# Patient Record
Sex: Female | Born: 1990 | Race: White | Hispanic: No | Marital: Married | Smoking: Never smoker
Health system: Southern US, Community
[De-identification: ages and names within clinical notes are randomized; demographics above are authoritative.]

## PROBLEM LIST (undated history)

## (undated) ENCOUNTER — Inpatient Hospital Stay: Payer: Self-pay

## (undated) DIAGNOSIS — N200 Calculus of kidney: Secondary | ICD-10-CM

## (undated) DIAGNOSIS — Z87442 Personal history of urinary calculi: Secondary | ICD-10-CM

## (undated) DIAGNOSIS — Z9889 Other specified postprocedural states: Secondary | ICD-10-CM

## (undated) DIAGNOSIS — N920 Excessive and frequent menstruation with regular cycle: Secondary | ICD-10-CM

## (undated) DIAGNOSIS — R112 Nausea with vomiting, unspecified: Secondary | ICD-10-CM

## (undated) DIAGNOSIS — E282 Polycystic ovarian syndrome: Secondary | ICD-10-CM

## (undated) DIAGNOSIS — N390 Urinary tract infection, site not specified: Secondary | ICD-10-CM

## (undated) DIAGNOSIS — E119 Type 2 diabetes mellitus without complications: Secondary | ICD-10-CM

## (undated) HISTORY — DX: Excessive and frequent menstruation with regular cycle: N92.0

## (undated) HISTORY — DX: Type 2 diabetes mellitus without complications: E11.9

## (undated) HISTORY — DX: Urinary tract infection, site not specified: N39.0

## (undated) HISTORY — DX: Calculus of kidney: N20.0

## (undated) HISTORY — PX: NO PAST SURGERIES: SHX2092

## (undated) HISTORY — PX: WISDOM TOOTH EXTRACTION: SHX21

## (undated) HISTORY — DX: Polycystic ovarian syndrome: E28.2

---

## 2010-10-23 DIAGNOSIS — N200 Calculus of kidney: Secondary | ICD-10-CM

## 2010-10-23 HISTORY — DX: Calculus of kidney: N20.0

## 2010-11-04 ENCOUNTER — Ambulatory Visit: Admit: 2010-11-04 | Payer: Self-pay | Admitting: Family Medicine

## 2011-06-27 ENCOUNTER — Emergency Department: Payer: Self-pay | Admitting: Emergency Medicine

## 2013-12-15 ENCOUNTER — Encounter: Payer: Self-pay | Admitting: Family Medicine

## 2013-12-15 ENCOUNTER — Ambulatory Visit (INDEPENDENT_AMBULATORY_CARE_PROVIDER_SITE_OTHER): Payer: 59 | Admitting: Family Medicine

## 2013-12-15 VITALS — BP 130/84 | HR 94 | Temp 98.0°F | Ht 63.5 in | Wt 220.0 lb

## 2013-12-15 DIAGNOSIS — Z113 Encounter for screening for infections with a predominantly sexual mode of transmission: Secondary | ICD-10-CM

## 2013-12-15 DIAGNOSIS — E119 Type 2 diabetes mellitus without complications: Secondary | ICD-10-CM | POA: Insufficient documentation

## 2013-12-15 DIAGNOSIS — R7303 Prediabetes: Secondary | ICD-10-CM

## 2013-12-15 DIAGNOSIS — R7309 Other abnormal glucose: Secondary | ICD-10-CM

## 2013-12-15 DIAGNOSIS — Z136 Encounter for screening for cardiovascular disorders: Secondary | ICD-10-CM | POA: Insufficient documentation

## 2013-12-15 DIAGNOSIS — Z0000 Encounter for general adult medical examination without abnormal findings: Secondary | ICD-10-CM | POA: Insufficient documentation

## 2013-12-15 NOTE — Progress Notes (Signed)
   Subjective:   Patient ID: [name], female    DOB: 04/01/1991, 23 y.o.   MRN: [id number]  [name] is a pleasant 23 y.o. year old female who presents to clinic today with Establish Care  on 12/15/2013  HPI: G0.  Just had first pap smear done at [facility]- was normal.  Was told she is pre diabetic.  For awhile, was checking her FSBS three times a day- stopped doing it once it was controlled- most fasting readings in low 100s.  Has tried to make changes in her diet.  Does have family history of HTN.  She is sexually active with one partner.    Patient Active Problem List   Diagnosis Date Noted  . Pre-diabetes 12/15/2013  . Routine general medical examination at a health care facility 12/15/2013  . Screening for ischemic heart disease 12/15/2013   Past Medical History  Diagnosis Date  . Kidney stone 2012  . UTI (lower urinary tract infection)    History reviewed. No pertinent past surgical history. History  Substance Use Topics  . Smoking status: Never Smoker   . Smokeless tobacco: Never Used  . Alcohol Use: Yes   Family History  Problem Relation Age of Onset  . Arthritis Paternal Grandmother   . Hypertension Paternal Grandmother   . Diabetes Paternal Grandmother    No Known Allergies No current outpatient prescriptions on file prior to visit.   No current facility-administered medications on file prior to visit.   The PMH, PSH, Social History, Family History, Medications, and allergies have [facility], and have been updated if relevant.   Review of Systems    see HPI Patient reports no  vision/ hearing changes,anorexia, weight change, fever ,adenopathy, persistant / recurrent hoarseness, swallowing issues, chest pain, edema,persistant / recurrent cough, hemoptysis, dyspnea(rest, exertional, paroxysmal nocturnal), gastrointestinal  bleeding (melena, rectal bleeding), abdominal pain, excessive heart burn, GU symptoms(dysuria,  hematuria, pyuria, voiding/incontinence  Issues) syncope, focal weakness, severe memory loss, concerning skin lesions, depression, anxiety, abnormal bruising/bleeding, major joint swelling, breast masses or abnormal vaginal bleeding.    Objective:    BP 130/84  Pulse 94  Temp(Src) 98 F (36.7 C) (Oral)  Ht 5' 3.5" (1.613 m)  Wt 220 lb (99.791 kg)  BMI 38.36 kg/m2  SpO2 98%  LMP 12/03/2013   Physical Exam  Nursing note and vitals reviewed. Constitutional: She is oriented to person, place, and time. She appears well-developed and well-nourished. No distress.  HENT:  Head: Normocephalic and atraumatic.  Eyes: Pupils are equal, round, and reactive to light.  Neck: Normal range of motion. Neck supple.  Cardiovascular: Normal rate and regular rhythm.   Pulmonary/Chest: Effort normal and breath sounds normal.  Abdominal: Soft. Bowel sounds are normal.  Musculoskeletal: Normal range of motion.  Neurological: She is alert and oriented to person, place, and time.  Skin: Skin is warm and dry.  Psychiatric: She has a normal mood and affect. Her behavior is normal. Judgment and thought content normal.          Assessment & Plan:   Pre-diabetes - Plan: Hemoglobin A1c  Routine general medical examination at a health care facility - Plan: CBC with Differential, Comprehensive metabolic panel  Screening for ischemic heart disease - Plan: Lipid Panel  Screening for STD (sexually transmitted disease) - Plan: HIV Antibody, RPR, HSV(herpes simplex vrs) 1+2 ab-IgG No Follow-up on file.

## 2013-12-15 NOTE — Assessment & Plan Note (Signed)
Check a1c today

## 2013-12-15 NOTE — Progress Notes (Signed)
Pre visit review using our clinic review tool, if applicable. No additional management support is needed unless otherwise documented below in the visit note. 

## 2013-12-15 NOTE — Patient Instructions (Signed)
We will call you with your lab results. You can also view your lab results online.  It was very nice to meet you.

## 2013-12-15 NOTE — Assessment & Plan Note (Signed)
Reviewed preventive care protocols, scheduled due services, and updated immunizations Discussed nutrition, exercise, diet, and healthy lifestyle.  Orders Placed This Encounter  Procedures  . Hemoglobin A1c  . CBC with Differential  . Comprehensive metabolic panel  . Lipid Panel  . HIV Antibody  . RPR  . HSV(herpes simplex vrs) 1+2 ab-IgG

## 2013-12-16 LAB — CBC WITH DIFFERENTIAL/PLATELET
Basophils Absolute: 0 10*3/uL (ref 0.0–0.2)
Basos: 0 %
EOS: 1 %
Eosinophils Absolute: 0.1 10*3/uL (ref 0.0–0.4)
HCT: 39.4 % (ref 34.0–46.6)
Hemoglobin: 13.2 g/dL (ref 11.1–15.9)
IMMATURE GRANULOCYTES: 0 %
Immature Grans (Abs): 0 10*3/uL (ref 0.0–0.1)
LYMPHS ABS: 2.9 10*3/uL (ref 0.7–3.1)
Lymphs: 41 %
MCH: 27.6 pg (ref 26.6–33.0)
MCHC: 33.5 g/dL (ref 31.5–35.7)
MCV: 82 fL (ref 79–97)
Monocytes Absolute: 0.3 10*3/uL (ref 0.1–0.9)
Monocytes: 5 %
Neutrophils Absolute: 3.7 10*3/uL (ref 1.4–7.0)
Neutrophils Relative %: 53 %
RBC: 4.78 x10E6/uL (ref 3.77–5.28)
RDW: 12.8 % (ref 12.3–15.4)
WBC: 7 10*3/uL (ref 3.4–10.8)

## 2013-12-16 LAB — HEMOGLOBIN A1C
Est. average glucose Bld gHb Est-mCnc: 140 mg/dL
Hgb A1c MFr Bld: 6.5 % — ABNORMAL HIGH (ref 4.8–5.6)

## 2013-12-16 LAB — COMPREHENSIVE METABOLIC PANEL
A/G RATIO: 1.4 (ref 1.1–2.5)
ALK PHOS: 60 IU/L (ref 39–117)
ALT: 19 IU/L (ref 0–32)
AST: 16 IU/L (ref 0–40)
Albumin: 4 g/dL (ref 3.5–5.5)
BUN / CREAT RATIO: 12 (ref 8–20)
BUN: 8 mg/dL (ref 6–20)
CO2: 20 mmol/L (ref 18–29)
CREATININE: 0.69 mg/dL (ref 0.57–1.00)
Calcium: 9.8 mg/dL (ref 8.7–10.2)
Chloride: 103 mmol/L (ref 97–108)
GFR calc Af Amer: 142 mL/min/{1.73_m2} (ref >59)
GFR, EST NON AFRICAN AMERICAN: 123 mL/min/{1.73_m2} (ref >59)
GLOBULIN, TOTAL: 2.8 g/dL (ref 1.5–4.5)
Glucose: 195 mg/dL — ABNORMAL HIGH (ref 65–99)
Potassium: 4.2 mmol/L (ref 3.5–5.2)
SODIUM: 142 mmol/L (ref 134–144)
Total Bilirubin: <0.2 mg/dL (ref 0.0–1.2)
Total Protein: 6.8 g/dL (ref 6.0–8.5)

## 2013-12-16 LAB — LIPID PANEL
CHOL/HDL RATIO: 2.9 ratio (ref 0.0–4.4)
Cholesterol, Total: 139 mg/dL (ref 100–189)
HDL: 48 mg/dL (ref >39)
LDL Calculated: 68 mg/dL (ref 0–119)
Triglycerides: 114 mg/dL (ref 0–114)
VLDL Cholesterol Cal: 23 mg/dL (ref 5–40)

## 2013-12-16 LAB — RPR: SYPHILIS RPR SCR: NONREACTIVE

## 2013-12-16 LAB — HIV ANTIBODY (ROUTINE TESTING W REFLEX): HIV-1/HIV-2 Ab: NONREACTIVE

## 2013-12-16 LAB — HSV(HERPES SIMPLEX VRS) I + II AB-IGG
HSV 1 IGG, TYPE SPEC: 60.9 {index} — AB (ref 0.00–0.90)
HSV 2 IGG,TYPE SPECIFIC AB: <0.91 index (ref 0.00–0.90)

## 2013-12-26 ENCOUNTER — Ambulatory Visit: Payer: 59 | Admitting: Family Medicine

## 2014-01-02 ENCOUNTER — Encounter: Payer: Self-pay | Admitting: Family Medicine

## 2014-01-02 ENCOUNTER — Ambulatory Visit (INDEPENDENT_AMBULATORY_CARE_PROVIDER_SITE_OTHER): Payer: 59 | Admitting: Family Medicine

## 2014-01-02 VITALS — BP 132/74 | HR 84 | Temp 97.9°F | Wt 223.5 lb

## 2014-01-02 DIAGNOSIS — N926 Irregular menstruation, unspecified: Secondary | ICD-10-CM

## 2014-01-02 DIAGNOSIS — N912 Amenorrhea, unspecified: Secondary | ICD-10-CM | POA: Insufficient documentation

## 2014-01-02 DIAGNOSIS — E119 Type 2 diabetes mellitus without complications: Secondary | ICD-10-CM

## 2014-01-02 MED ORDER — METFORMIN HCL 500 MG PO TABS: 500.0000 mg | ORAL_TABLET | Freq: Two times a day (BID) | ORAL | Status: DC

## 2014-01-02 NOTE — Patient Instructions (Signed)
Good to see you,  [name]. Please call us to let us know which meter you have at home.  We will call you with your diabetic nutritionist referral.  Take Metformin as directed- 1 tablet twice daily.

## 2014-01-02 NOTE — Progress Notes (Signed)
Pre visit review using our clinic review tool, if applicable. No additional management support is needed unless otherwise documented below in the visit note. 

## 2014-01-02 NOTE — Progress Notes (Signed)
   Subjective:   Patient ID: [name], female    DOB: 09/13/1991, 23 y.o.   MRN: [id number]  [name] is a pleasant 23 y.o. year old female who presents to clinic today with Diabetes  on 01/02/2014  HPI: H/o prediabetes for years, recent lab work done here showed she is now a diabetic.  Lab Results  Component Value Date   HGBA1C 6.5* 12/15/2013   Strong FH of diabetes- dad is insulin dependent.  Has not noticed any increased thirst or urination.  Patient Active Problem List   Diagnosis Date Noted  . Amenorrhea 01/02/2014  . Diabetes mellitus, new onset 12/15/2013  . Routine general medical examination at a health care facility 12/15/2013  . Screening for ischemic heart disease 12/15/2013   Past Medical History  Diagnosis Date  . Kidney stone 2012  . UTI (lower urinary tract infection)    No past surgical history on file. History  Substance Use Topics  . Smoking status: Never Smoker   . Smokeless tobacco: Never Used  . Alcohol Use: Yes   Family History  Problem Relation Age of Onset  . Arthritis Paternal Grandmother   . Hypertension Paternal Grandmother   . Diabetes Paternal Grandmother    No Known Allergies Current Outpatient Prescriptions on File Prior to Visit  Medication Sig Dispense Refill  . drospirenone-ethinyl estradiol (YAZ,GIANVI,LORYNA) 3-0.02 MG tablet Take 1 tablet by mouth daily.       No current facility-administered medications on file prior to visit.   The PMH, PSH, Social History, Family History, Medications, and allergies have been [facility], and have been updated if relevant.   Review of Systems See HPI No increased thirst or urination No blurred vision    Objective:    BP 132/74  Pulse 84  Temp(Src) 97.9 F (36.6 C) (Oral)  Wt 223 lb 8 oz (101.379 kg)  SpO2 98%  LMP 12/19/2013   Physical Exam Gen:  Alert, pleasant, NAD Psych:  Good eye contact, not anxious or depressed appearing       Assessment &  Plan:   Irregular menstrual cycle - Plan: POCT urine pregnancy  Amenorrhea  Diabetes mellitus, new onset - Plan: Amb Referral to Nutrition and Diabetic E Return in about 3 months (around 04/04/2014) for medication follow up..Marland Kitchen

## 2014-01-02 NOTE — Assessment & Plan Note (Signed)
>  25 minutes spent in face to face time with patient, >50% spent in counselling or coordination of care. Start Metformin 500 mg twice daily. Refer to diabetic nutritionist. Given Eat Right Diet handout. She has glucometer at home- will call us with name so we can send in rx for test strips. Follow up in 3 months.

## 2014-01-09 ENCOUNTER — Telehealth: Payer: Self-pay | Admitting: *Deleted

## 2014-01-09 NOTE — Telephone Encounter (Signed)
Spoke to pt who was seen last week and prescribed metformin. She reports that she has PCOS and has been experiencing sharp pains in her stomach. She is questioning if this could be a s/s of starting metformin, and if so is there anything that she can take or be given to help subside pain

## 2014-01-13 NOTE — Telephone Encounter (Signed)
Metformin can cause some abdominal cramping and diarrhea.  There is not much we can do except stop the Metformin if the pain is too severe.  Please call to check on her tomorrow.

## 2014-01-14 NOTE — Telephone Encounter (Signed)
Lm on pts vm requesting a call back 

## 2014-01-15 NOTE — Telephone Encounter (Signed)
Lm on pts vm requesting a call back 

## 2014-01-16 NOTE — Telephone Encounter (Signed)
vm received from pt indicating ok to leave detailed message. Advised pt per Dr Dayton MartesAron and requested that she contact office back with an update of s/s

## 2015-02-18 ENCOUNTER — Other Ambulatory Visit: Payer: Self-pay | Admitting: Family Medicine

## 2016-05-25 LAB — OB RESULTS CONSOLE ABO/RH: "RH Type ": POSITIVE

## 2016-05-25 LAB — OB RESULTS CONSOLE GC/CHLAMYDIA
Chlamydia: NEGATIVE
Gonorrhea: NEGATIVE

## 2016-05-25 LAB — OB RESULTS CONSOLE RUBELLA ANTIBODY, IGM
RUBELLA: IMMUNE
Rubella: IMMUNE

## 2016-05-25 LAB — OB RESULTS CONSOLE ANTIBODY SCREEN: Antibody Screen: NEGATIVE

## 2016-05-25 LAB — OB RESULTS CONSOLE VARICELLA ZOSTER ANTIBODY, IGG: Varicella: IMMUNE

## 2016-05-30 LAB — OB RESULTS CONSOLE VARICELLA ZOSTER ANTIBODY, IGG: Varicella: IMMUNE

## 2016-09-27 ENCOUNTER — Encounter: Payer: Self-pay | Admitting: *Deleted

## 2016-09-27 ENCOUNTER — Observation Stay
Admission: EM | Admit: 2016-09-27 | Discharge: 2016-09-27 | Disposition: A | Discharge status: Home / Self Care | Payer: BLUE CROSS/BLUE SHIELD | Attending: Obstetrics & Gynecology | Admitting: Obstetrics & Gynecology

## 2016-09-27 DIAGNOSIS — O368120 Decreased fetal movements, second trimester, not applicable or unspecified: Secondary | ICD-10-CM | POA: Diagnosis not present

## 2016-09-27 DIAGNOSIS — Z3A25 25 weeks gestation of pregnancy: Secondary | ICD-10-CM | POA: Insufficient documentation

## 2016-09-27 DIAGNOSIS — R10A1 Flank pain, right side: Secondary | ICD-10-CM | POA: Diagnosis present

## 2016-09-27 DIAGNOSIS — R109 Unspecified abdominal pain: Secondary | ICD-10-CM | POA: Insufficient documentation

## 2016-09-27 DIAGNOSIS — O368190 Decreased fetal movements, unspecified trimester, not applicable or unspecified: Secondary | ICD-10-CM | POA: Diagnosis present

## 2016-09-27 DIAGNOSIS — M549 Dorsalgia, unspecified: Secondary | ICD-10-CM | POA: Diagnosis not present

## 2016-09-27 DIAGNOSIS — O26892 Other specified pregnancy related conditions, second trimester: Secondary | ICD-10-CM | POA: Insufficient documentation

## 2016-09-27 LAB — URINALYSIS, ROUTINE W REFLEX MICROSCOPIC
BILIRUBIN URINE: NEGATIVE
GLUCOSE, UA: NEGATIVE mg/dL
HGB URINE DIPSTICK: NEGATIVE
KETONES UR: NEGATIVE mg/dL
Leukocytes, UA: NEGATIVE
Nitrite: NEGATIVE
PH: 5 (ref 5.0–8.0)
PROTEIN: NEGATIVE mg/dL
Specific Gravity, Urine: 1.017 (ref 1.005–1.030)

## 2016-09-27 MED ORDER — ONDANSETRON HCL 4 MG/2ML IJ SOLN: 4.0000 mg | Freq: Four times a day (QID) | INTRAMUSCULAR | Status: DC | PRN

## 2016-09-27 MED ORDER — ACETAMINOPHEN 325 MG PO TABS: 650.0000 mg | ORAL_TABLET | ORAL | Status: DC | PRN

## 2016-09-27 NOTE — Final Progress Note (Addendum)
Physician Final Progress Note  Patient ID: [name] N [place] MRN: [id number] DOB/AGE: 25/06/1991 25 y.o.  Admit date: 09/27/2016 Admitting provider: Nadara Mustardobert P Joshuah Minella, MD Discharge date: 09/27/2016   Admission Diagnoses: Right flank pain, Decreased fetal movement, [redacted] weeks pregnant  Discharge Diagnoses:  Principal Problem:   Right flank pain   Decreased fetal movements  Consults: None  Significant Findings/ Diagnostic Studies: Pt seen and examined, no s/sx PTL.  Back pain associated with musculoskeletal concerns.  UA performed.  FHT 140s, fetal movements felt.  PMH- none PSH- none SH- no tob or EtOH FH- no GYN cancers, no birth defects All- none ROS- neg except as above EXAM- AF, VSS Abd gravid NT Back- No CVAT, mild R flank T Extr- no edema FHT 140s  Procedures:  none  Discharge Condition: good  Disposition: Final discharge disposition not confirmed  Diet: Regular diet  Discharge Activity: Activity as tolerated     Medication List    STOP taking these medications   drospirenone-ethinyl estradiol 3-0.02 MG tablet Commonly known as:  YAZ,GIANVI,LORYNA   metFORMIN 500 MG tablet Commonly known as:  GLUCOPHAGE        Total time spent taking care of this patient: 15 minutes  Signed: Letitia Libraobert Paul Marasia Newhall 09/27/2016, 5:12 PM

## 2016-09-27 NOTE — OB Triage Note (Signed)
Pt. Here for decreased fetal movement, last time patient felt baby move was last night. U/S applied - FHR 130s, Toco applied - abd. Soft.  Pt. Also experiencing groin pressure and right lower back pain 5/5.  Last sexual encounter was a week ago.  Pt. Denies sudden gush of fluid or vaginal bleeding.

## 2016-09-27 NOTE — Discharge Summary (Signed)
  See FPN 

## 2016-09-27 NOTE — Discharge Instructions (Signed)
Discharge instructions reviewed with patient, pt. Verbalized understanding, copy signed and given.  Pt. Stable.

## 2016-10-20 LAB — OB RESULTS CONSOLE HGB/HCT, BLOOD
HCT: 34 %
HEMOGLOBIN: 11 g/dL

## 2016-10-20 LAB — OB RESULTS CONSOLE HEPATITIS B SURFACE ANTIGEN: Hepatitis B Surface Ag: NEGATIVE

## 2016-10-20 LAB — OB RESULTS CONSOLE PLATELET COUNT: Platelets: 324 10*3/uL

## 2016-10-20 LAB — OB RESULTS CONSOLE RPR
RPR: NONREACTIVE
RPR: NONREACTIVE

## 2016-10-20 LAB — OB RESULTS CONSOLE GC/CHLAMYDIA
CHLAMYDIA, DNA PROBE: NEGATIVE
Gonorrhea: NEGATIVE

## 2016-10-20 LAB — OB RESULTS CONSOLE HIV ANTIBODY (ROUTINE TESTING)
HIV: NONREACTIVE
HIV: NONREACTIVE

## 2016-11-09 ENCOUNTER — Observation Stay
Admission: EM | Admit: 2016-11-09 | Discharge: 2016-11-09 | Disposition: A | Discharge status: Home / Self Care | Payer: 59 | Attending: Obstetrics and Gynecology | Admitting: Obstetrics and Gynecology

## 2016-11-09 DIAGNOSIS — Z3A31 31 weeks gestation of pregnancy: Secondary | ICD-10-CM | POA: Insufficient documentation

## 2016-11-09 DIAGNOSIS — O368130 Decreased fetal movements, third trimester, not applicable or unspecified: Secondary | ICD-10-CM | POA: Diagnosis present

## 2016-11-09 DIAGNOSIS — O368190 Decreased fetal movements, unspecified trimester, not applicable or unspecified: Secondary | ICD-10-CM | POA: Diagnosis present

## 2016-11-09 DIAGNOSIS — Z79899 Other long term (current) drug therapy: Secondary | ICD-10-CM | POA: Diagnosis not present

## 2016-11-09 MED ORDER — PRENATAL MULTIVITAMIN CH: 1.0000 | ORAL_TABLET | Freq: Every day | ORAL | Status: DC

## 2016-11-09 MED ORDER — ACETAMINOPHEN 325 MG PO TABS: 650.0000 mg | ORAL_TABLET | ORAL | Status: DC | PRN

## 2016-11-09 MED ORDER — CALCIUM CARBONATE ANTACID 500 MG PO CHEW: 2.0000 | CHEWABLE_TABLET | ORAL | Status: DC | PRN

## 2016-11-09 MED ORDER — DOCUSATE SODIUM 100 MG PO CAPS: 100.0000 mg | ORAL_CAPSULE | Freq: Every day | ORAL | Status: DC

## 2016-11-09 NOTE — Final Progress Note (Signed)
Physician Final Progress Note  Patient ID: [name] N [place] MRN: [id number] DOB/AGE: 26/06/1991 25 y.o.  Admit date: 11/09/2016 Admitting provider: Vena AustriaAndreas Darnell Jeschke, MD Discharge date: 11/09/2016   Admission Diagnoses: Decreased fetal movement  Discharge Diagnoses:  Active Problems:   Decreased fetal movement  26 yo G1 at 3357w2d presenting with decreased fetal movement  Consults: None  Significant Findings/ Diagnostic Studies: none  Procedures: NST reactive 130, moderate variability, +accels, no decels  Discharge Condition: good  Disposition: 01-Home or Self Care  Diet: Regular diet  Discharge Activity: Activity as tolerated  Discharge Instructions    Discharge activity:  No Restrictions    Complete by:  As directed    Discharge diet:  No restrictions    Complete by:  As directed    Fetal Kick Count:  Lie on our left side for one hour after a meal, and count the number of times your baby kicks.  If it is less than 5 times, get up, move around and drink some juice.  Repeat the test 30 minutes later.  If it is still less than 5 kicks in an hour, notify your doctor.    Complete by:  As directed    No sexual activity restrictions    Complete by:  As directed    Notify physician for a general feeling that "something is not right"    Complete by:  As directed    Notify physician for increase or change in vaginal discharge    Complete by:  As directed    Notify physician for intestinal cramps, with or without diarrhea, sometimes described as "gas pain"    Complete by:  As directed    Notify physician for leaking of fluid    Complete by:  As directed    Notify physician for low, dull backache, unrelieved by heat or Tylenol    Complete by:  As directed    Notify physician for menstrual like cramps    Complete by:  As directed    Notify physician for pelvic pressure    Complete by:  As directed    Notify physician for uterine contractions.  These may be painless and feel  like the uterus is tightening or the baby is  "balling up"    Complete by:  As directed    Notify physician for vaginal bleeding    Complete by:  As directed    PRETERM LABOR:  Includes any of the follwing symptoms that occur between 20 - [redacted] weeks gestation.  If these symptoms are not stopped, preterm labor can result in preterm delivery, placing your baby at risk    Complete by:  As directed      Allergies as of 11/09/2016   No Known Allergies     Medication List    TAKE these medications   multivitamin-prenatal 27-0.8 MG Tabs tablet Take 1 tablet by mouth daily at 12 noon.        Total time spent taking care of this patient: 15 minutes  Signed: Lorrene ReidSTAEBLER, Beatrice Ziehm M 11/09/2016, 6:41 PM

## 2016-11-09 NOTE — OB Triage Note (Signed)
Pt presents c/o no fetal movement since 6:00 am this morning. Pt denies any bleeding, LOF, or NVD. Pt does complain of intermittent pain in the lower back and abdomen that comes and goes, rating it a 7 or 8 on a 0-10 scale.

## 2016-11-09 NOTE — Discharge Summary (Signed)
See final progress note. 

## 2016-12-11 ENCOUNTER — Observation Stay
Admission: EM | Admit: 2016-12-11 | Discharge: 2016-12-12 | Disposition: A | Discharge status: Home / Self Care | Payer: 59 | Attending: Certified Nurse Midwife | Admitting: Certified Nurse Midwife

## 2016-12-11 DIAGNOSIS — Z3A35 35 weeks gestation of pregnancy: Secondary | ICD-10-CM | POA: Insufficient documentation

## 2016-12-11 DIAGNOSIS — O4703 False labor before 37 completed weeks of gestation, third trimester: Secondary | ICD-10-CM | POA: Diagnosis not present

## 2016-12-11 LAB — URINALYSIS, COMPLETE (UACMP) WITH MICROSCOPIC
BACTERIA UA: NONE SEEN
Bilirubin Urine: NEGATIVE
Glucose, UA: >=500 mg/dL — AB
Hgb urine dipstick: NEGATIVE
KETONES UR: 5 mg/dL — AB
LEUKOCYTES UA: NEGATIVE
Nitrite: NEGATIVE
Protein, ur: NEGATIVE mg/dL
Specific Gravity, Urine: 1.027 (ref 1.005–1.030)
pH: 6 (ref 5.0–8.0)

## 2016-12-11 MED ORDER — LACTATED RINGERS IV SOLN: 500.0000 mL | INTRAVENOUS | Status: DC | PRN

## 2016-12-11 NOTE — Final Progress Note (Signed)
Physician Final Progress Note  Patient ID: [name] MRN: [id number] DOB/AGE: 26/06/1991 26 y.o.  Admit date: 12/11/2016 Admitting provider: Conard NovakStephen D Jackson, MD/ Gasper Lloydolleen L. Sharen HonesGutierrez, CNM Discharge date: 12/11/2016   Admission Diagnoses: IUP at 35wk6d with lower back pain Contractions  Discharge Diagnoses: Same  Consults: none  Significant Findings/ Diagnostic Studies: 26 year old G1 P0 with EDC=01/09/2017 presented at 35wk 6days with c/o constant back pain and contractions every 5 min since 2030 tonight. Has been having SI joint pain for weeks now. Occasionally uses heat on back and does stretching exercises. Denies dysuria, vaginal bleeding, foul odor to urine. Baby active. [facility] remarkable for obesity, an early elevated one hour GTT with a normal 3 hr GTT, and a net weight gain of 5# with pregnancy. Exam: General: gravid WF in NAD Patient Vitals for the past 24 hrs:  BP Temp Temp src Pulse Resp Height Weight  12/11/16 2335 126/74 - - 99 - - -  12/11/16 2320 117/74 - - (!) 128 - - -  12/11/16 2307 122/64 - - (!) 133 - - -  12/11/16 2250 139/70 - - (!) 115 - - -  12/11/16 2231 (!) 145/66 97.7 F (36.5 C) Oral (!) 103 18 - -  12/11/16 2226 - - - - - 5\' 5"  (1.651 m) 224 lb (101.6 kg)  Abdomen: obese, cephalic on leopold's FHR: baseline 130s with accelerations to 160s to 170, moderate variability Toco: q7-8+ min apart Back: tenderness in SI joints Cervix: closed/60%/-2/posterior. Nitrazine negative  A: IUP at 35wk6d, not in labor SI joint pain  P: DC home Discussed comfort measures for her back FU at prenatal appt 2/23 Procedures: none  Discharge Condition: stable  Disposition: 01-Home or Self Care  Diet: Regular diet  Discharge Activity: Activity as tolerated  Discharge Instructions    Discharge patient    Complete by:  As directed    Discharge disposition:  01-Home or Self Care   Discharge patient date:  12/11/2016     Allergies as  of 12/11/2016   No Known Allergies     Medication List    TAKE these medications   multivitamin-prenatal 27-0.8 MG Tabs tablet Take 1 tablet by mouth daily at 12 noon.        Total time spent taking care of this patient: 15 minutes  Signed: Farrel ConnersGUTIERREZ, Nichalas Coin 12/11/2016, 11:52 PM

## 2016-12-11 NOTE — Discharge Instructions (Signed)
Discharge instructions given, all questions answered.

## 2016-12-11 NOTE — OB Triage Note (Signed)
Patient arrived in triage with complaints of lower back and lower abdomen that is intermittent approx 5 mins apart since 2030. Positive fetal movement noted, denies leaking of fluid and vaginal bleeding. Abdomen palpates soft. EFM applied. Farrel Connersolleen Gutierrez, CNM in department and aware of patient's arrival. Discussed plan of care with patient. Patient verbalized understanding.

## 2016-12-12 DIAGNOSIS — O4703 False labor before 37 completed weeks of gestation, third trimester: Secondary | ICD-10-CM | POA: Diagnosis not present

## 2016-12-12 NOTE — OB Triage Note (Signed)
Patient given discharge instructions including preterm labor precautions, fetal kick count instructions, and follow up information. Patient and family verbalized understanding. All questions answered. Patient discharged home in stable condition, ambulatory, accompanied by spouse and mother.

## 2016-12-13 LAB — URINE CULTURE
CULTURE: NO GROWTH
SPECIAL REQUESTS: NORMAL

## 2016-12-19 ENCOUNTER — Other Ambulatory Visit: Payer: Self-pay | Admitting: Certified Nurse Midwife

## 2016-12-19 DIAGNOSIS — O26843 Uterine size-date discrepancy, third trimester: Secondary | ICD-10-CM

## 2016-12-22 ENCOUNTER — Encounter: Payer: Self-pay | Admitting: Certified Nurse Midwife

## 2016-12-22 ENCOUNTER — Other Ambulatory Visit: Payer: 59

## 2016-12-22 ENCOUNTER — Other Ambulatory Visit: Payer: Self-pay

## 2016-12-22 ENCOUNTER — Encounter (HOSPITAL_COMMUNITY): Payer: Self-pay

## 2016-12-29 ENCOUNTER — Observation Stay
Admission: EM | Admit: 2016-12-29 | Discharge: 2016-12-29 | Disposition: A | Discharge status: Home / Self Care | Payer: 59 | Attending: Obstetrics and Gynecology | Admitting: Obstetrics and Gynecology

## 2016-12-29 ENCOUNTER — Ambulatory Visit (INDEPENDENT_AMBULATORY_CARE_PROVIDER_SITE_OTHER): Payer: 59 | Admitting: Obstetrics & Gynecology

## 2016-12-29 VITALS — BP 120/80 | HR 94 | Wt 230.0 lb

## 2016-12-29 DIAGNOSIS — O99213 Obesity complicating pregnancy, third trimester: Secondary | ICD-10-CM

## 2016-12-29 DIAGNOSIS — Z3A00 Weeks of gestation of pregnancy not specified: Secondary | ICD-10-CM | POA: Diagnosis not present

## 2016-12-29 DIAGNOSIS — Z3A38 38 weeks gestation of pregnancy: Secondary | ICD-10-CM | POA: Diagnosis not present

## 2016-12-29 DIAGNOSIS — O368190 Decreased fetal movements, unspecified trimester, not applicable or unspecified: Secondary | ICD-10-CM | POA: Diagnosis not present

## 2016-12-29 DIAGNOSIS — O09813 Supervision of pregnancy resulting from assisted reproductive technology, third trimester: Secondary | ICD-10-CM | POA: Insufficient documentation

## 2016-12-29 DIAGNOSIS — O368130 Decreased fetal movements, third trimester, not applicable or unspecified: Principal | ICD-10-CM | POA: Insufficient documentation

## 2016-12-29 DIAGNOSIS — O4693 Antepartum hemorrhage, unspecified, third trimester: Secondary | ICD-10-CM

## 2016-12-29 MED ORDER — ACETAMINOPHEN 325 MG PO TABS: 650.0000 mg | ORAL_TABLET | ORAL | Status: DC | PRN

## 2016-12-29 NOTE — Progress Notes (Signed)
Discussed macrosomia and IOL risks, will wait for favorable exam first. PNV,FMC, Labor precautions.  Breast feeding.

## 2016-12-29 NOTE — Final Progress Note (Addendum)
Physician Final Progress Note  Patient ID: [name] MRN: [id number] DOB/AGE: 26/06/1991 26 y.o.  Admit date: 12/29/2016 Admitting provider: Nadara Mustardobert P Harris, MD Discharge date: 12/29/2016   Admission Diagnoses: brown discharge  Discharge Diagnoses:  Manson PasseyBrown discharge  Consults: None  Significant Findings/ Diagnostic Studies: NST 150, moderate, +accels, decels  Procedures: NST 150, moderate variability, +accels, no decels  Discharge Condition: good  Disposition: 01-Home or Self Care  Diet: Regular diet  Discharge Activity: Activity as tolerated  Discharge Instructions    Discharge activity:  No Restrictions    Complete by:  As directed    Discharge diet:  No restrictions    Complete by:  As directed    No sexual activity restrictions    Complete by:  As directed    Notify physician for a general feeling that "something is not right"    Complete by:  As directed    Notify physician for increase or change in vaginal discharge    Complete by:  As directed    Notify physician for intestinal cramps, with or without diarrhea, sometimes described as "gas pain"    Complete by:  As directed    Notify physician for leaking of fluid    Complete by:  As directed    Notify physician for low, dull backache, unrelieved by heat or Tylenol    Complete by:  As directed    Notify physician for menstrual like cramps    Complete by:  As directed    Notify physician for pelvic pressure    Complete by:  As directed    Notify physician for uterine contractions.  These may be painless and feel like the uterus is tightening or the baby is  "balling up"    Complete by:  As directed    Notify physician for vaginal bleeding    Complete by:  As directed    PRETERM LABOR:  Includes any of the follwing symptoms that occur between 20 - [redacted] weeks gestation.  If these symptoms are not stopped, preterm labor can result in preterm delivery, placing your baby at risk    Complete by:  As directed       Allergies as of 12/29/2016   No Known Allergies     Medication List    TAKE these medications   multivitamin-prenatal 27-0.8 MG Tabs tablet Take 1 tablet by mouth daily at 12 noon.      Follow-up Information    [facility]. Go on 01/05/2017.   Specialty:  Obstetrics and Gynecology Contact information: [address] [place] [phone]       [facility] .   Contact information: [address] [place] [phone]          Total time spent taking care of this patient: 20 minutes  Signed: Drinda ButtsAmdreas Devlyn Retter 12/29/2016, 8:15 PM

## 2016-12-29 NOTE — Discharge Summary (Signed)
See final progress note. 

## 2016-12-29 NOTE — OB Triage Note (Signed)
Pt presents to L&D with c/o decreased fetal movements since about 9am. Pt reports attending MD appt at 0830 and had sve, has reported brownish discharge since sve. Report some contractions since appt, but nothing painful or regular. EFM applied and explained. Plan to monitor fetal and maternal well being and ass pt complaint.

## 2016-12-29 NOTE — Discharge Instructions (Signed)
Call provider or return to birthplace with: ° °1. Strong regular contractions every 5 minutes. °2. Leaking of fluid from your vagina °3. Vaginal bleeding: Bright red or heavy like a period °4. Decreased Fetal movement ° °

## 2017-01-01 ENCOUNTER — Encounter: Payer: Self-pay | Admitting: Advanced Practice Midwife

## 2017-01-02 ENCOUNTER — Inpatient Hospital Stay
Admission: EM | Admit: 2017-01-02 | Discharge: 2017-01-06 | DRG: 765 | Disposition: A | Discharge status: Home / Self Care | Payer: 59 | Attending: Obstetrics and Gynecology | Admitting: Obstetrics and Gynecology

## 2017-01-02 DIAGNOSIS — Z8249 Family history of ischemic heart disease and other diseases of the circulatory system: Secondary | ICD-10-CM

## 2017-01-02 DIAGNOSIS — O99213 Obesity complicating pregnancy, third trimester: Secondary | ICD-10-CM | POA: Diagnosis present

## 2017-01-02 DIAGNOSIS — Z3A39 39 weeks gestation of pregnancy: Secondary | ICD-10-CM | POA: Diagnosis not present

## 2017-01-02 DIAGNOSIS — O4202 Full-term premature rupture of membranes, onset of labor within 24 hours of rupture: Secondary | ICD-10-CM | POA: Diagnosis present

## 2017-01-02 DIAGNOSIS — D62 Acute posthemorrhagic anemia: Secondary | ICD-10-CM | POA: Diagnosis not present

## 2017-01-02 DIAGNOSIS — O99824 Streptococcus B carrier state complicating childbirth: Secondary | ICD-10-CM | POA: Diagnosis present

## 2017-01-02 DIAGNOSIS — Z833 Family history of diabetes mellitus: Secondary | ICD-10-CM | POA: Diagnosis not present

## 2017-01-02 DIAGNOSIS — Z98891 History of uterine scar from previous surgery: Secondary | ICD-10-CM

## 2017-01-02 DIAGNOSIS — O9081 Anemia of the puerperium: Secondary | ICD-10-CM | POA: Diagnosis not present

## 2017-01-02 DIAGNOSIS — O09813 Supervision of pregnancy resulting from assisted reproductive technology, third trimester: Secondary | ICD-10-CM

## 2017-01-02 LAB — CBC
HCT: 34.7 % — ABNORMAL LOW (ref 35.0–47.0)
Hemoglobin: 11.7 g/dL — ABNORMAL LOW (ref 12.0–16.0)
MCH: 25.7 pg — ABNORMAL LOW (ref 26.0–34.0)
MCHC: 33.6 g/dL (ref 32.0–36.0)
MCV: 76.4 fL — ABNORMAL LOW (ref 80.0–100.0)
PLATELETS: 332 10*3/uL (ref 150–440)
RBC: 4.54 MIL/uL (ref 3.80–5.20)
RDW: 13.9 % (ref 11.5–14.5)
WBC: 15.4 10*3/uL — ABNORMAL HIGH (ref 3.6–11.0)

## 2017-01-02 LAB — TYPE AND SCREEN
ABO/RH(D): B POS
Antibody Screen: NEGATIVE

## 2017-01-02 MED ORDER — OXYTOCIN 40 UNITS IN LACTATED RINGERS INFUSION - SIMPLE MED
2.5000 [IU]/h | INTRAVENOUS | Status: DC
  Filled 2017-01-02: qty 1000

## 2017-01-02 MED ORDER — OXYTOCIN BOLUS FROM INFUSION: 500.0000 mL | Freq: Once | INTRAVENOUS | Status: DC

## 2017-01-02 MED ORDER — LIDOCAINE HCL (PF) 1 % IJ SOLN
INTRAMUSCULAR | Status: AC
  Filled 2017-01-02: qty 30

## 2017-01-02 MED ORDER — LIDOCAINE HCL (PF) 1 % IJ SOLN: 30.0000 mL | INTRAMUSCULAR | Status: DC | PRN

## 2017-01-02 MED ORDER — ONDANSETRON HCL 4 MG/2ML IJ SOLN: 4.0000 mg | Freq: Four times a day (QID) | INTRAMUSCULAR | Status: DC | PRN

## 2017-01-02 MED ORDER — LACTATED RINGERS IV SOLN
INTRAVENOUS | Status: DC
Start: 2017-01-02 — End: 2017-01-03
  Administered 2017-01-02 – 2017-01-03 (×3): via INTRAVENOUS

## 2017-01-02 MED ORDER — PENICILLIN G POTASSIUM 5000000 UNITS IJ SOLR
5.0000 10*6.[IU] | Freq: Once | INTRAMUSCULAR | Status: AC
  Administered 2017-01-02: 5 10*6.[IU] via INTRAVENOUS
  Filled 2017-01-02: qty 5

## 2017-01-02 MED ORDER — OXYTOCIN 10 UNIT/ML IJ SOLN
INTRAMUSCULAR | Status: AC
  Filled 2017-01-02: qty 2

## 2017-01-02 MED ORDER — ACETAMINOPHEN 325 MG PO TABS: 650.0000 mg | ORAL_TABLET | ORAL | Status: DC | PRN

## 2017-01-02 MED ORDER — LACTATED RINGERS IV SOLN
500.0000 mL | INTRAVENOUS | Status: DC | PRN
  Administered 2017-01-03 (×2): 1000 mL via INTRAVENOUS

## 2017-01-02 MED ORDER — AMMONIA AROMATIC IN INHA
RESPIRATORY_TRACT | Status: AC
  Filled 2017-01-02: qty 10

## 2017-01-02 MED ORDER — PENICILLIN G POT IN DEXTROSE 60000 UNIT/ML IV SOLN
3.0000 10*6.[IU] | INTRAVENOUS | Status: DC
  Administered 2017-01-03 (×4): 3 10*6.[IU] via INTRAVENOUS
  Filled 2017-01-02 (×12): qty 50

## 2017-01-02 MED ORDER — MISOPROSTOL 200 MCG PO TABS
ORAL_TABLET | ORAL | Status: AC
  Filled 2017-01-02: qty 4

## 2017-01-02 MED ORDER — SOD CITRATE-CITRIC ACID 500-334 MG/5ML PO SOLN
30.0000 mL | ORAL | Status: DC | PRN
  Filled 2017-01-02: qty 15

## 2017-01-02 MED ORDER — SODIUM CHLORIDE FLUSH 0.9 % IV SOLN
INTRAVENOUS | Status: AC
  Administered 2017-01-03: 10 mL
  Filled 2017-01-02: qty 20

## 2017-01-02 NOTE — OB Triage Note (Signed)
On arrival, pt pants soaked, grossly ruptured, nitrazine pos.

## 2017-01-02 NOTE — Progress Notes (Signed)
Dr Bonney AidStaebler paged, call returned, made aware of pt arrival to triage, grossly ruptured, clear, GBS pos. 1cm. Will place order and come see pt.

## 2017-01-02 NOTE — H&P (Signed)
Obstetric H&P   Chief Complaint: Leaking fluid  Prenatal Care Provider: [facility]  History of Present Illness: 26 y.o. G1P0000 [redacted]w[redacted]d by 01/09/2017, by 8 week Ultrasound presenting to L&D with leakage of fluid around 1800 this evening.  +FM, no VB, no ctx.    [place] notable for obesity, 6lbs weight gain this pregnancy, fetal growth 12/22/16 at [redacted]w[redacted]d 3535g or 7lbs 13oz  B pos / RI / VZI / HBsAg neg / RPR NR / HIV neg / NIPT testing normal XY / early 1-hr 176 with 3-hr normal 28 week 1-hr was 116 / GBS positive.  Review of Systems: 10 point review of systems negative unless otherwise noted in HPI  Past Medical History: Past Medical History:  Diagnosis Date  . Kidney stone 2012  . Menorrhagia   . Polycystic ovarian disease   . UTI (lower urinary tract infection)     Past Surgical History: Past Surgical History:  Procedure Laterality Date  . NO PAST SURGERIES    . WISDOM TOOTH EXTRACTION      Family History: Family History  Problem Relation Age of Onset  . Heart disease Maternal Grandmother   . Thyroid cancer Maternal Grandmother   . Diabetes type II Paternal Grandfather   . Arthritis Paternal Grandmother   . Hypertension Paternal Grandmother   . Diabetes Paternal Grandmother     Social History: Social History   Social History  . Marital status: Married    Spouse name: Amalia Hailey  . Number of children: N/A  . Years of education: N/A   Occupational History  . 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 Smoking status: Never Smoker  . Smokeless tobacco: Never Used  . Alcohol use No  . Drug use: No  . Sexual activity: Yes   Other Topics Concern  . 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    Medications: Prior to Admission medications   Medication Sig Start Date End Date Taking? Authorizing Provider  acetaminophen (TYLENOL) 500 MG tablet Take 500 mg by mouth every 6  (six) hours as needed (back pain).   Yes Historical Provider, MD  calcium carbonate (TUMS) 500 MG chewable tablet Chew 1 tablet by mouth daily.   Yes Historical Provider, MD  Prenatal Vit-Fe Fumarate-FA (MULTIVITAMIN-PRENATAL) 27-0.8 MG TABS tablet Take 1 tablet by mouth daily at 12 noon.   Yes Historical Provider, MD    Allergies: No Known Allergies  Physical Exam: Vitals: Blood pressure 128/77, pulse (!) 101, temperature 98.1 F (36.7 C), temperature source Oral, resp. rate 20, height 5\' 5"  (1.651 m), weight 230 lb (104.3 kg).  Urine Dip Protein: N/A  FHT: 130, moderate, +accels, no decels Toco: q27min  General: NAD HEENT: normocephalic, anicteric Pulmonary: No increased work of breathing Cardiovascular: RRR, distal pulses 2+ Abdomen: Gravid, non-tender Leopolds: vtx Genitourinary: 1cm on admission Extremities: no edema, erythema, or tenderness Neurologic: Grossly intact Psychiatric: mood appropriate, affect full  Labs: No results found for this or any previous visit (from the past 24 hour(s)).  Assessment: 26 y.o. G1P0000 [redacted]w[redacted]d by 01/09/2017, prelabor rupture of membranes Plan: 1) Prelabor rupture of membranes - contracting every 2-3 minutes, expectant management, pitocin augmentation if fails to make cervical change or contractions space out  2) Fetus - cat I tracing  3) PNL - B pos / RI / VZI /  HBsAg neg / RPR NR / HIV neg / NIPT testing normal XY / early 1-hr 176 with 3-hr normal 28 week 1-hr was 116 / GBS positive.  4) TDAP - 11/13/16  5) Breast / minipill  6) Disposition - pending delivery

## 2017-01-02 NOTE — Progress Notes (Signed)
Pt arrived into room with s/o and family present at side, no acute distress noted. Pt states she had a large gush of fluid around 0655 ish, clear, states she continues to have small gushes of fluid and began having lower back pain rates 6/10 after water broke. Pt was last seen in OB clinic this past Fri, cervix barely anything. GBS pos. Plans for IV pain meds and Epidural for labor pain mgmt. Expecting baby boy, breastfeeding, [facility].

## 2017-01-03 ENCOUNTER — Inpatient Hospital Stay: Payer: 59 | Admitting: Anesthesiology

## 2017-01-03 ENCOUNTER — Encounter: Payer: Self-pay | Admitting: *Deleted

## 2017-01-03 ENCOUNTER — Encounter
Admission: EM | Disposition: A | Discharge status: Home / Self Care | Payer: Self-pay | Source: Home / Self Care | Attending: Obstetrics and Gynecology

## 2017-01-03 DIAGNOSIS — Z98891 History of uterine scar from previous surgery: Secondary | ICD-10-CM

## 2017-01-03 SURGERY — Surgical Case
Anesthesia: Epidural | Site: Abdomen | Wound class: Clean Contaminated

## 2017-01-03 MED ORDER — LIDOCAINE-EPINEPHRINE (PF) 1.5 %-1:200000 IJ SOLN
INTRAMUSCULAR | Status: DC | PRN
  Administered 2017-01-03: 3 mL

## 2017-01-03 MED ORDER — MENTHOL 3 MG MT LOZG
1.0000 | LOZENGE | OROMUCOSAL | Status: DC | PRN
  Filled 2017-01-03: qty 9

## 2017-01-03 MED ORDER — LACTATED RINGERS IV SOLN
INTRAVENOUS | Status: DC | PRN
  Administered 2017-01-03: 18:00:00 via INTRAVENOUS

## 2017-01-03 MED ORDER — SENNOSIDES-DOCUSATE SODIUM 8.6-50 MG PO TABS
2.0000 | ORAL_TABLET | ORAL | Status: DC
  Administered 2017-01-04 – 2017-01-06 (×3): 2 via ORAL
  Filled 2017-01-03 (×3): qty 2

## 2017-01-03 MED ORDER — SODIUM CHLORIDE 0.9 % IV SOLN
INTRAVENOUS | Status: DC | PRN
  Administered 2017-01-03 (×3): 5 mL via EPIDURAL

## 2017-01-03 MED ORDER — BUPIVACAINE HCL (PF) 0.5 % IJ SOLN
5.0000 mL | Freq: Once | INTRAMUSCULAR | Status: DC
  Filled 2017-01-03: qty 30

## 2017-01-03 MED ORDER — DEXTROSE 5 % IV SOLN
500.0000 mg | INTRAVENOUS | Status: AC
  Administered 2017-01-03: 500 mg via INTRAVENOUS
  Filled 2017-01-03: qty 500

## 2017-01-03 MED ORDER — FENTANYL CITRATE (PF) 100 MCG/2ML IJ SOLN
INTRAMUSCULAR | Status: AC
  Filled 2017-01-03: qty 2

## 2017-01-03 MED ORDER — FENTANYL 2.5 MCG/ML W/ROPIVACAINE 0.2% IN NS 100 ML EPIDURAL INFUSION (ARMC-ANES)
EPIDURAL | Status: DC | PRN
Start: 2017-01-03 — End: 2017-01-03
  Administered 2017-01-03: 10 mL/h via EPIDURAL

## 2017-01-03 MED ORDER — MEPERIDINE HCL 25 MG/ML IJ SOLN: 6.2500 mg | INTRAMUSCULAR | Status: DC | PRN

## 2017-01-03 MED ORDER — FENTANYL CITRATE (PF) 100 MCG/2ML IJ SOLN
INTRAMUSCULAR | Status: DC | PRN
  Administered 2017-01-03 (×2): 50 ug via INTRAVENOUS

## 2017-01-03 MED ORDER — OXYTOCIN 40 UNITS IN LACTATED RINGERS INFUSION - SIMPLE MED
1.0000 m[IU]/min | INTRAVENOUS | Status: DC
  Administered 2017-01-03: 500 mL via INTRAVENOUS
  Administered 2017-01-03: 2 m[IU]/min via INTRAVENOUS
  Filled 2017-01-03: qty 1000

## 2017-01-03 MED ORDER — ONDANSETRON HCL 4 MG/2ML IJ SOLN
INTRAMUSCULAR | Status: DC | PRN
  Administered 2017-01-03: 4 mg via INTRAVENOUS

## 2017-01-03 MED ORDER — OXYTOCIN 40 UNITS IN LACTATED RINGERS INFUSION - SIMPLE MED
2.5000 [IU]/h | INTRAVENOUS | Status: AC
  Filled 2017-01-03: qty 1000

## 2017-01-03 MED ORDER — MORPHINE SULFATE (PF) 2 MG/ML IV SOLN
1.0000 mg | INTRAVENOUS | Status: AC | PRN
  Administered 2017-01-04 (×5): 1 mg via INTRAVENOUS
  Filled 2017-01-03 (×5): qty 1

## 2017-01-03 MED ORDER — TERBUTALINE SULFATE 1 MG/ML IJ SOLN: 0.2500 mg | Freq: Once | INTRAMUSCULAR | Status: DC | PRN

## 2017-01-03 MED ORDER — LIDOCAINE HCL 2 % IJ SOLN
INTRAMUSCULAR | Status: AC
  Filled 2017-01-03: qty 20

## 2017-01-03 MED ORDER — ONDANSETRON HCL 4 MG/2ML IJ SOLN
INTRAMUSCULAR | Status: AC
  Filled 2017-01-03: qty 2

## 2017-01-03 MED ORDER — BUPIVACAINE 0.25 % ON-Q PUMP DUAL CATH 400 ML
400.0000 mL | INJECTION | Status: DC
  Filled 2017-01-03 (×2): qty 400

## 2017-01-03 MED ORDER — DIPHENHYDRAMINE HCL 50 MG/ML IJ SOLN: 12.5000 mg | INTRAMUSCULAR | Status: DC | PRN

## 2017-01-03 MED ORDER — CHLOROPROCAINE HCL 1 % IJ SOLN
INTRAMUSCULAR | Status: DC | PRN
  Administered 2017-01-03: 3 mL
  Administered 2017-01-03: 4 mL

## 2017-01-03 MED ORDER — IBUPROFEN 600 MG PO TABS
600.0000 mg | ORAL_TABLET | Freq: Four times a day (QID) | ORAL | Status: DC | PRN
  Administered 2017-01-04: 600 mg via ORAL
  Filled 2017-01-03 (×5): qty 1

## 2017-01-03 MED ORDER — SODIUM CHLORIDE 0.9% FLUSH: 3.0000 mL | INTRAVENOUS | Status: DC | PRN

## 2017-01-03 MED ORDER — OXYCODONE-ACETAMINOPHEN 5-325 MG PO TABS: 2.0000 | ORAL_TABLET | ORAL | Status: DC | PRN

## 2017-01-03 MED ORDER — DIBUCAINE 1 % RE OINT: 1.0000 "application " | TOPICAL_OINTMENT | RECTAL | Status: DC | PRN

## 2017-01-03 MED ORDER — BUTORPHANOL TARTRATE 1 MG/ML IJ SOLN
1.0000 mg | INTRAMUSCULAR | Status: DC | PRN
  Administered 2017-01-03 (×2): 1 mg via INTRAVENOUS
  Administered 2017-01-03: 2 mg via INTRAVENOUS
  Filled 2017-01-03: qty 1
  Filled 2017-01-03: qty 2
  Filled 2017-01-03: qty 1

## 2017-01-03 MED ORDER — WITCH HAZEL-GLYCERIN EX PADS: 1.0000 "application " | MEDICATED_PAD | CUTANEOUS | Status: DC | PRN

## 2017-01-03 MED ORDER — NALBUPHINE HCL 10 MG/ML IJ SOLN: 5.0000 mg | Freq: Once | INTRAMUSCULAR | Status: DC | PRN

## 2017-01-03 MED ORDER — ONDANSETRON HCL 4 MG/2ML IJ SOLN: 4.0000 mg | Freq: Once | INTRAMUSCULAR | Status: DC | PRN

## 2017-01-03 MED ORDER — NALBUPHINE HCL 10 MG/ML IJ SOLN: 5.0000 mg | INTRAMUSCULAR | Status: DC | PRN

## 2017-01-03 MED ORDER — NALOXONE HCL 0.4 MG/ML IJ SOLN: 0.4000 mg | INTRAMUSCULAR | Status: DC | PRN

## 2017-01-03 MED ORDER — SIMETHICONE 80 MG PO CHEW
80.0000 mg | CHEWABLE_TABLET | Freq: Three times a day (TID) | ORAL | Status: DC
  Administered 2017-01-04 – 2017-01-06 (×7): 80 mg via ORAL
  Filled 2017-01-03 (×7): qty 1

## 2017-01-03 MED ORDER — BUPIVACAINE HCL (PF) 0.5 % IJ SOLN: 5.0000 mL | Freq: Once | INTRAMUSCULAR | Status: DC

## 2017-01-03 MED ORDER — ONDANSETRON HCL 4 MG/2ML IJ SOLN: 4.0000 mg | Freq: Three times a day (TID) | INTRAMUSCULAR | Status: DC | PRN

## 2017-01-03 MED ORDER — LACTATED RINGERS IV BOLUS (SEPSIS)
1000.0000 mL | Freq: Once | INTRAVENOUS | Status: AC
  Administered 2017-01-03: 1000 mL via INTRAVENOUS

## 2017-01-03 MED ORDER — PRENATAL MULTIVITAMIN CH
1.0000 | ORAL_TABLET | Freq: Every day | ORAL | Status: DC
  Administered 2017-01-04 – 2017-01-05 (×2): 1 via ORAL
  Filled 2017-01-03 (×2): qty 1

## 2017-01-03 MED ORDER — LIDOCAINE HCL 2 % EX GEL
CUTANEOUS | Status: AC
  Filled 2017-01-03: qty 5

## 2017-01-03 MED ORDER — OXYTOCIN 40 UNITS IN LACTATED RINGERS INFUSION - SIMPLE MED
INTRAVENOUS | Status: AC
  Administered 2017-01-03: 20:00:00
  Filled 2017-01-03: qty 1000

## 2017-01-03 MED ORDER — CEFAZOLIN SODIUM-DEXTROSE 2-3 GM-% IV SOLR
2.0000 g | INTRAVENOUS | Status: AC
  Administered 2017-01-03: 2 g via INTRAVENOUS
  Filled 2017-01-03: qty 50

## 2017-01-03 MED ORDER — SCOPOLAMINE 1 MG/3DAYS TD PT72: 1.0000 | MEDICATED_PATCH | Freq: Once | TRANSDERMAL | Status: DC

## 2017-01-03 MED ORDER — DIPHENHYDRAMINE HCL 25 MG PO CAPS: 25.0000 mg | ORAL_CAPSULE | ORAL | Status: DC | PRN

## 2017-01-03 MED ORDER — COCONUT OIL OIL
1.0000 "application " | TOPICAL_OIL | Status: DC | PRN
  Filled 2017-01-03: qty 120

## 2017-01-03 MED ORDER — OXYCODONE-ACETAMINOPHEN 5-325 MG PO TABS: 1.0000 | ORAL_TABLET | ORAL | Status: DC | PRN

## 2017-01-03 MED ORDER — EPHEDRINE SULFATE-NACL 50-0.9 MG/10ML-% IV SOSY
PREFILLED_SYRINGE | INTRAVENOUS | Status: DC | PRN
  Administered 2017-01-03: 10 mg via INTRAVENOUS

## 2017-01-03 MED ORDER — DIPHENHYDRAMINE HCL 25 MG PO CAPS: 25.0000 mg | ORAL_CAPSULE | Freq: Four times a day (QID) | ORAL | Status: DC | PRN

## 2017-01-03 MED ORDER — CEFAZOLIN SODIUM-DEXTROSE 2-4 GM/100ML-% IV SOLN
2.0000 g | INTRAVENOUS | Status: DC
Start: 2017-01-03 — End: 2017-01-03
  Filled 2017-01-03: qty 100

## 2017-01-03 MED ORDER — MORPHINE SULFATE (PF) 0.5 MG/ML IJ SOLN
INTRAMUSCULAR | Status: AC
  Filled 2017-01-03: qty 10

## 2017-01-03 MED ORDER — FENTANYL CITRATE (PF) 100 MCG/2ML IJ SOLN
25.0000 ug | INTRAMUSCULAR | Status: DC | PRN
  Administered 2017-01-03 (×4): 25 ug via INTRAVENOUS
  Filled 2017-01-03: qty 2

## 2017-01-03 MED ORDER — PHENYLEPHRINE HCL 10 MG/ML IJ SOLN
INTRAMUSCULAR | Status: DC | PRN
  Administered 2017-01-03: 200 ug via INTRAVENOUS
  Administered 2017-01-03: 100 ug via INTRAVENOUS
  Administered 2017-01-03 (×2): 200 ug via INTRAVENOUS
  Administered 2017-01-03 (×6): 100 ug via INTRAVENOUS

## 2017-01-03 MED ORDER — MORPHINE SULFATE (PF) 0.5 MG/ML IJ SOLN
INTRAMUSCULAR | Status: DC | PRN
  Administered 2017-01-03: 2 mg via INTRAVENOUS
  Administered 2017-01-03: 3 mg via EPIDURAL

## 2017-01-03 MED ORDER — FENTANYL 2.5 MCG/ML W/ROPIVACAINE 0.2% IN NS 100 ML EPIDURAL INFUSION (ARMC-ANES)
EPIDURAL | Status: AC
  Filled 2017-01-03: qty 100

## 2017-01-03 MED ORDER — FERROUS SULFATE 325 (65 FE) MG PO TABS
325.0000 mg | ORAL_TABLET | Freq: Two times a day (BID) | ORAL | Status: DC
  Administered 2017-01-04 – 2017-01-06 (×5): 325 mg via ORAL
  Filled 2017-01-03 (×5): qty 1

## 2017-01-03 MED ORDER — SOD CITRATE-CITRIC ACID 500-334 MG/5ML PO SOLN
30.0000 mL | ORAL | Status: AC
  Administered 2017-01-03: 30 mL via ORAL

## 2017-01-03 MED ORDER — DEXTROSE 5 % IV SOLN
1.0000 ug/kg/h | INTRAVENOUS | Status: DC | PRN
  Filled 2017-01-03: qty 2

## 2017-01-03 MED ORDER — IBUPROFEN 600 MG PO TABS: 600.0000 mg | ORAL_TABLET | Freq: Four times a day (QID) | ORAL | Status: DC

## 2017-01-03 MED ORDER — LIDOCAINE HCL (PF) 2 % IJ SOLN
INTRAMUSCULAR | Status: DC | PRN
  Administered 2017-01-03: 200 mg via EPIDURAL
  Administered 2017-01-03: 80 mg via INTRADERMAL
  Administered 2017-01-03: 120 mg via EPIDURAL
  Administered 2017-01-03: 200 mg via EPIDURAL

## 2017-01-03 MED ORDER — LACTATED RINGERS IV SOLN
INTRAVENOUS | Status: DC
  Administered 2017-01-04: 08:00:00 via INTRAVENOUS

## 2017-01-03 MED ORDER — BUPIVACAINE HCL 0.5 % IJ SOLN
INTRAMUSCULAR | Status: DC | PRN
  Administered 2017-01-03: 10 mL

## 2017-01-03 SURGICAL SUPPLY — 30 items
CANISTER SUCT 3000ML (MISCELLANEOUS) ×3 IMPLANT
CATH KIT ON-Q SILVERSOAK 5IN (CATHETERS) ×6 IMPLANT
CLOSURE WOUND 1/2 X4 (GAUZE/BANDAGES/DRESSINGS) ×1
DRSG OPSITE POSTOP 4X10 (GAUZE/BANDAGES/DRESSINGS) ×3 IMPLANT
DRSG TELFA 3X8 NADH (GAUZE/BANDAGES/DRESSINGS) ×3 IMPLANT
ELECT CAUTERY BLADE 6.4 (BLADE) ×3 IMPLANT
ELECT REM PT RETURN 9FT ADLT (ELECTROSURGICAL) ×3
ELECTRODE REM PT RTRN 9FT ADLT (ELECTROSURGICAL) ×1 IMPLANT
GAUZE SPONGE 4X4 12PLY STRL (GAUZE/BANDAGES/DRESSINGS) ×3 IMPLANT
GLOVE BIO SURGEON STRL SZ7 (GLOVE) ×3 IMPLANT
GLOVE INDICATOR 7.5 STRL GRN (GLOVE) ×3 IMPLANT
GOWN STRL REUS W/ TWL LRG LVL3 (GOWN DISPOSABLE) ×3 IMPLANT
GOWN STRL REUS W/TWL LRG LVL3 (GOWN DISPOSABLE) ×6
HEMOSTAT ARISTA ABSORB 3G PWDR (MISCELLANEOUS) ×3 IMPLANT
LIQUID BAND (GAUZE/BANDAGES/DRESSINGS) ×3 IMPLANT
NS IRRIG 1000ML POUR BTL (IV SOLUTION) ×3 IMPLANT
PACK C SECTION AR (MISCELLANEOUS) ×3 IMPLANT
PAD OB MATERNITY 4.3X12.25 (PERSONAL CARE ITEMS) ×6 IMPLANT
PAD PREP 24X41 OB/GYN DISP (PERSONAL CARE ITEMS) ×3 IMPLANT
SPONGE LAP 18X18 5 PK (GAUZE/BANDAGES/DRESSINGS) ×6 IMPLANT
STRIP CLOSURE SKIN 1/2X4 (GAUZE/BANDAGES/DRESSINGS) ×2 IMPLANT
SUT CHROMIC GUT BROWN 0 54 (SUTURE) ×1 IMPLANT
SUT CHROMIC GUT BROWN 0 54IN (SUTURE) ×3
SUT MNCRL 4-0 (SUTURE) ×2
SUT MNCRL 4-0 27XMFL (SUTURE) ×1
SUT PDS AB 1 TP1 96 (SUTURE) ×3 IMPLANT
SUT PLAIN 2 0 XLH (SUTURE) ×3 IMPLANT
SUT VIC AB 0 CT1 36 (SUTURE) ×12 IMPLANT
SUTURE MNCRL 4-0 27XMF (SUTURE) ×1 IMPLANT
SWABSTK COMLB BENZOIN TINCTURE (MISCELLANEOUS) ×3 IMPLANT

## 2017-01-03 NOTE — Discharge Summary (Signed)
OB Discharge Summary     Patient Name: [name] DOB: [birth year] MRN: [id number]  Date of admission: 01/02/2017 Delivering MD: Thomasene Mohair, MD  Date of Delivery: 01/03/2017  Date of discharge: 01/06/2017  Admitting diagnosis:  1) intrauterine pregnancy at [redacted]w[redacted]d  2) premature rupture of membranes without onset of labor  Intrauterine pregnancy: [redacted]w[redacted]d     Secondary diagnosis: None     Discharge diagnosis: Term Pregnancy Delivered                                                                                                Post partum procedures:none  Augmentation: Pitocin  Complications: None  Hospital course:  Onset of Labor With Unplanned C/S  26 y.o. yo G1P1001 at [redacted]w[redacted]d was admitted in Latent Labor on 01/02/2017. Patient had a labor course significant for fetal intolerance to labor. She achieved dilation of 7cm and she was unable to be augmented. Membrane Rupture Time/Date: 6:55 PM ,01/02/2017   The patient went for cesarean section due to Non-Reassuring FHR, and delivered a Viable infant,01/03/2017  Details of operation can be found in separate operative note. Patient had an uncomplicated postpartum course.  She is ambulating,tolerating a regular diet, passing flatus, and urinating well.  Patient is discharged home in stable condition 01/06/17.  Physical exam  Vitals:   01/05/17 1932 01/05/17 2315 01/06/17 0315 01/06/17 0819  BP: (!) 113/53   118/62  Pulse: 90   88  Resp: 20   18  Temp: 98 F (36.7 C) 97.6 F (36.4 C) 97.8 F (36.6 C) 97.8 F (36.6 C)  TempSrc: Oral Axillary Oral Oral  SpO2:    98%  Weight:      Height:       General: alert, cooperative and no distress Lochia: appropriate Uterine Fundus: firm Incision: Healing well with no significant drainage, Dressing is clean, dry, and intact DVT Evaluation: No evidence of DVT seen on physical exam. No cords or calf tenderness. No significant calf/ankle edema.  Labs: Lab Results  Component Value Date   WBC 14.4 (H) 01/04/2017   HGB 8.2 (L) 01/04/2017   HCT 24.6 (L) 01/04/2017   MCV 78.2 (L) 01/04/2017   PLT 222 01/04/2017    Discharge instruction: per After Visit Summary.  Medications:  Allergies as of 01/06/2017   No Known Allergies     Medication List    TAKE these medications   acetaminophen 500 MG tablet Commonly known as:  TYLENOL Take 500 mg by mouth every 6 (six) hours as needed (back pain).   ibuprofen 600 MG tablet Commonly known as:  ADVIL,MOTRIN Take 1 tablet (600 mg total) by mouth every 6 (six) hours as needed for mild pain.   multivitamin-prenatal 27-0.8 MG Tabs tablet Take 1 tablet by mouth daily at 12 noon.   norethindrone 0.35 MG tablet Commonly known as:  MICRONOR,CAMILA,ERRIN Take 1 tablet (0.35 mg total) by mouth daily.   oxyCODONE-acetaminophen 5-325 MG tablet Commonly known as:  PERCOCET/ROXICET Take 2 tablets by mouth every 4 (four) hours as needed (breakthrough pain).   TUMS 500 MG chewable tablet Generic drug:  calcium  carbonate Chew 1 tablet by mouth daily.       Diet: routine diet  Activity: Advance as tolerated. Pelvic rest for 6 weeks.   Outpatient follow up: Follow-up Information    Thomasene MohairStephen Jaydrien Wassenaar, MD Follow up in 1 week(s).   Specialty:  Obstetrics and Gynecology Why:  post op incision check Contact information: [address] [place] [id number] [phone]             Postpartum contraception: Progesterone only pills Rhogam Given postpartum: no Rubella vaccine given postpartum: no Varicella vaccine given postpartum: no TDaP given antepartum or postpartum: AP  Newborn Data: Live born female  Birth Weight: 8 lb 13.1 oz (4000 g) APGAR: 8, 9   Baby Feeding: Breast  Disposition:home with mother  SIGNED: Thomasene MohairStephen Athenia Rys, MD 01/06/2017 8:55 AM

## 2017-01-03 NOTE — Transfer of Care (Signed)
Immediate Anesthesia Transfer of Care Note  Patient: [name]  Procedure(s) Performed: Procedure(s): CESAREAN SECTION (N/A)  Patient Location: PACU  Anesthesia Type:Epidural  Level of Consciousness: awake, alert  and oriented  Airway & Oxygen Therapy: Patient Spontanous Breathing  Post-op Assessment: Report given to RN and Post -op Vital signs reviewed and stable  Post vital signs: Reviewed  Last Vitals:  Vitals:   01/03/17 1754 01/03/17 1934  BP: 138/76 111/83  Pulse: (!) 145 (!) 104  Resp:  18  Temp:  36.8 C    Last Pain:  Vitals:   01/03/17 1700  TempSrc:   PainSc: 0-No pain         Complications: No apparent anesthesia complications

## 2017-01-03 NOTE — Progress Notes (Signed)
Patient ID: Donetta Pottsllyson Dunk, female   DOB: 09/19/1991, 26 y.o.   MRN: [id number]  Labor Check  Subj:  Complaints: comfortable with epidural   Obj:  BP 128/71   Pulse (!) 152   Temp 98.3 F (36.8 C) (Oral)   Resp 18   Ht 5\' 5"  (1.651 m)   Wt 230 lb (104.3 kg)   SpO2 99%   BMI 38.27 kg/m  Dose (milli-units/min) Oxytocin: 0 milli-units/min  Cervix: Dilation: 6.0 / Effacement (%): 80 / Station: -1   IUPC replaced Baseline FHR: 120 bpm    Variability: moderate   Accelerations: present    Decelerations: absent Contractions: absent frequency: n/a  A/P: 26 y.o. G1P0000 female at 4664w1d with PROM without spontaneous labor.  1.  Labor: resume pitocin. Briefly discussed prior decelerations.  Will try to slowly titrate pitocin, but am working to affect delivery given length of ROM.  If unable to safely provoke contractions/labor, may need to consider c-section.  However, still hopeful for vaginal delivery. 2.  FWB: reassuring, Overall assessment: category 1 without contractions  3.  GBS pos - PCN  4.  Pain: epidural 5.  Recheck: prn, once adequate   Thomasene MohairStephen Jackson, MD 01/03/2017 2:11 PM

## 2017-01-03 NOTE — Progress Notes (Signed)
Patient ID: [name], female   DOB: 11/27/1990, 26 y.o.   MRN: [id number]  Labor Check  Subj:  Complaints: comfortable with epidural   Obj:  BP (!) 121/57   Pulse (!) 122   Temp 98.3 F (36.8 C) (Oral)   Resp 18   Ht 5\' 5"  (1.651 m)   Wt 230 lb (104.3 kg)   SpO2 96%   BMI 38.27 kg/m  Dose (milli-units/min) Oxytocin: 0 milli-units/min  Cervix: Dilation: 7 / Effacement (%): 80 / Station: -1, -2  Baseline FHR: 135 bpm    Variability: moderate    Accelerations: present    Decelerations: present (recurrent late decelerations) Contractions: present frequency: 2-3 q 10 min  A/P: 26 y.o. G1P0000 female at 645w1d with PROM, fetal intolerance of labor 1.  Labor: move to c-section, given inability to move forward in the labor process. The pitocin level was up to 4 with a prolonged late deceleration and even with pitocin off, occasional maternal contraction elicits late deceleration. Since she is remote from delivery and I am unable to get her to have contractions of any strength, I recommend cesarean delivery.  Risks/benefits of cesarean delivery discussed. I have consented the patient personally.  2.  FWB: category 2 3.  GBS pos - PCN  4.  Pain: epidural  Thomasene MohairStephen Rosia Syme, MD 01/03/2017 5:15 PM

## 2017-01-03 NOTE — Anesthesia Post-op Follow-up Note (Cosign Needed)
Anesthesia QCDR form completed.        

## 2017-01-03 NOTE — Anesthesia Procedure Notes (Signed)
Epidural Patient location during procedure: OB Start time: 01/03/2017 11:55 AM End time: 01/03/2017 12:13 PM  Staffing Anesthesiologist: Alver FisherPENWARDEN, [name]: anesthesiologist   Preanesthetic Checklist Completed: patient identified, site marked, surgical consent, pre-op evaluation, timeout Peterson, IV checked, risks and benefits discussed and monitors and equipment checked  Epidural Patient position: sitting Prep: ChloraPrep Patient monitoring: heart rate, continuous pulse ox and blood pressure Approach: midline Location: L4-L5 Injection technique: LOR saline  Needle:  Needle type: Tuohy  Needle gauge: 18 G Needle length: 9 cm and 9 Needle insertion depth: 9 cm Catheter type: closed end flexible Catheter size: 20 Guage Catheter at skin depth: 14 cm Test dose: negative (0.125% bupivacaine)  Assessment Events: blood not aspirated, injection not painful, no injection resistance, negative IV test and no paresthesia  Additional Notes   Patient tolerated the insertion well without complications.Reason for block:procedure for pain

## 2017-01-03 NOTE — Anesthesia Preprocedure Evaluation (Signed)
Anesthesia Evaluation  Patient identified by MRN, date of birth, ID band Patient awake    Reviewed: Allergy & Precautions, NPO status , Patient's Chart, lab work & pertinent test results  History of Anesthesia Complications Negative for: history of anesthetic complications  Airway Mallampati: III  TM Distance: >3 FB Neck ROM: Full    Dental no notable dental hx.    Pulmonary neg pulmonary ROS, neg sleep apnea, neg COPD,    breath sounds clear to auscultation- rhonchi (-) wheezing      Cardiovascular Exercise Tolerance: Good (-) hypertension(-) CAD and (-) Past MI  Rhythm:Regular Rate:Normal - Systolic murmurs and - Diastolic murmurs    Neuro/Psych negative neurological ROS  negative psych ROS   GI/Hepatic negative GI ROS, Neg liver ROS,   Endo/Other  Diabetes: diet controlled.  Renal/GU Renal disease: hx of nephrolithiasis.     Musculoskeletal negative musculoskeletal ROS (+)   Abdominal (+) + obese, Gravid abdomen  Peds  Hematology negative hematology ROS (+)   Anesthesia Other Findings   Reproductive/Obstetrics (+) Pregnancy                             Anesthesia Physical Anesthesia Plan  ASA: II  Anesthesia Plan: Epidural   Post-op Pain Management:    Induction:   Airway Management Planned:   Additional Equipment:   Intra-op Plan:   Post-operative Plan:   Informed Consent: I have reviewed the patients History and Physical, chart, labs and discussed the procedure including the risks, benefits and alternatives for the proposed anesthesia with the patient or authorized representative who has indicated his/her understanding and acceptance.     Plan Discussed with: Anesthesiologist  Anesthesia Plan Comments: (Plan for epidural for labor, discussed epidural vs spinal vs GA if need for csection)        Lab Results  Component Value Date   WBC 15.4 (H) 01/02/2017   HGB 11.7 (L) 01/02/2017   HCT 34.7 (L) 01/02/2017   MCV 76.4 (L) 01/02/2017   PLT 332 01/02/2017    Anesthesia Quick Evaluation

## 2017-01-03 NOTE — Progress Notes (Signed)
Patient ID: Donetta Pottsllyson Pipkins, female   DOB: 09/16/1991, 26 y.o.   MRN: [id number]  Labor Check  Subj:  Complaints: painful ctx   Obj:  BP 109/60 (BP Location: Right Arm)   Pulse 82   Temp 98.1 F (36.7 C) (Oral)   Resp 16   Ht 5\' 5"  (1.651 m)   Wt 230 lb (104.3 kg)   BMI 38.27 kg/m  Dose (milli-units/min) Oxytocin: 8 milli-units/min  Cervix: Dilation: 4 / Effacement (%): 70, 80 / Station: -2   IUPC placed without difficulty Baseline FHR: 120 bpm    Variability: moderate    Accelerations: present    Decelerations: present (variable decelerations, frequent, to 90s-100s, not >50% of ctx, quick return to baseline) Contractions: present frequency: 3 q 10 min  A/P: 26 y.o. G1P0000 female at 6564w1d with PROM.  1.  Labor: continue pitocin per protocol.  IUPC placed.  2.  FWB: reassuring overall, though monitoring closely due to variables.  Starting amnioinfusion now to help with variable decelerations, Overall assessment: category 2  3.  GBS positive, PCN  4.  Pain: prn, states she will want epidural 5.  Recheck: prn   Thomasene MohairStephen Tulani Kidney, MD 01/03/2017 10:41 AM

## 2017-01-03 NOTE — Op Note (Signed)
Cesarean Section Operative Note    [name]   01/03/2017   Pre-operative Diagnosis:  1) intrauterine pregnancy at 1312w1d  2) fetal intolerance of labor  Post-operative Diagnosis:  1) intrauterine pregnancy at 212w1d  2) fetal intolerance of labor  Procedure: Primary Low Transverse Cesarean Section via Pfannenstiel incision with double layer uterine closure  Surgeon: Surgeon(s) and Role:    * Conard NovakStephen D Kenyada Dosch, MD - Primary   Anesthesia: epidural   Findings:  1) normal appearing gravid uterus, fallopian tubes, and ovaries 2) viable female infant with weight 4,000 grams and APGARs 8 and 9   Estimated Blood Loss: 1,000 mL  Total IV Fluids: 1,200 ml   Urine Output: 250 mL clear urine at end of procedure  Specimens: none  Complications: no complications  Disposition: PACU - hemodynamically stable.   Maternal Condition: stable   Baby condition / location:  Couplet care / Skin to Skin  Procedure Details:  The patient was seen in the Holding Room. The risks, benefits, complications, treatment options, and expected outcomes were discussed with the patient. The patient concurred with the proposed plan, giving informed consent. identified as [name] and the procedure verified as C-Section Delivery. A Time Out was held and the above information confirmed.   After induction of anesthesia, the patient was draped and prepped in the usual sterile manner. A Pfannenstiel incision was made and carried down through the subcutaneous tissue to the fascia. Fascial incision was made and extended transversely. The fascia was separated from the underlying rectus tissue superiorly and inferiorly. The peritoneum was identified and entered. Peritoneal incision was extended longitudinally. The bladder flap was not freed from the lower uterine segment. A low transverse uterine incision was made and the hysterotomy was extended with cranial-caudal tension. Delivered from cephalic presentation was  a 4,000 gram Living newborn infant(s) or Female with Apgar scores of 8 at one minute and 9 at five minutes. Cord ph was not sent the umbilical cord was clamped and cut cord blood was not obtained for evaluation. The placenta was removed Intact and appeared normal. The uterine outline, tubes and ovaries appeared normal. The uterine incision was closed with running locked sutures of 0 Vicryl.  A second layer of the same suture was thrown in an imbricating fashion.  Mild general venous oozing made hemostatic with Arista 3 grams.  The uterus was returned to the abdomen and the paracolic gutters were cleared of all clots and debris.  The rectus muscles were inspected and found to be hemostatic.  The On-Q catheter pumps were inserted in accordance with the manufacturer's recommendations.  The catheters were inserted approximately 4cm cephelad to the incision line, approximately 1cm apart, straddling the midline.  They were inserted to a depth of the 4th mark. They were positioned superficial to the rectus abdominus muscles and deep to the rectus fascia.    The fascia was then reapproximated with running sutures of 1-0 PDS, looped. The subcutaneous tissue was reapproximated using 2-0 plain gut such that no greater than 2cm of dead space remained. The subcuticular closure was performed using 4-0 monocryl. The skin closure was reinforced using surgical skin glue.   The On-Q catheters were bolused with 5 mL of 0.5% marcaine plain for a total of 10 mL.  The catheters were affixed to the skin with surgical skin glue, steri-strips, and tegaderm.    Instrument, sponge, and needle counts were correct prior the abdominal closure and were correct at the conclusion of the case.  The patient received Ancef 2 grams and azithromycin 500 mg IV prior to skin incision (within 30 minutes). For VTE prophylaxis she was wearing SCDs throughout the case.  Signed: Conard Novak, MD 01/03/2017 7:29 PM

## 2017-01-04 ENCOUNTER — Encounter: Payer: Self-pay | Admitting: *Deleted

## 2017-01-04 LAB — CBC
HEMATOCRIT: 24.6 % — AB (ref 35.0–47.0)
HEMOGLOBIN: 8.2 g/dL — AB (ref 12.0–16.0)
MCH: 26.2 pg (ref 26.0–34.0)
MCHC: 33.5 g/dL (ref 32.0–36.0)
MCV: 78.2 fL — AB (ref 80.0–100.0)
Platelets: 222 10*3/uL (ref 150–440)
RBC: 3.14 MIL/uL — ABNORMAL LOW (ref 3.80–5.20)
RDW: 14.1 % (ref 11.5–14.5)
WBC: 14.4 10*3/uL — ABNORMAL HIGH (ref 3.6–11.0)

## 2017-01-04 LAB — SYPHILIS: RPR W/REFLEX TO RPR TITER AND TREPONEMAL ANTIBODIES, TRADITIONAL SCREENING AND DIAGNOSIS ALGORITHM: RPR Ser Ql: NONREACTIVE

## 2017-01-04 MED ORDER — IBUPROFEN 600 MG PO TABS
600.0000 mg | ORAL_TABLET | Freq: Four times a day (QID) | ORAL | Status: DC
  Administered 2017-01-04 – 2017-01-06 (×8): 600 mg via ORAL
  Filled 2017-01-04 (×4): qty 1

## 2017-01-04 MED ORDER — OXYCODONE-ACETAMINOPHEN 5-325 MG PO TABS
1.0000 | ORAL_TABLET | ORAL | Status: DC | PRN
  Administered 2017-01-05: 1 via ORAL
  Filled 2017-01-04: qty 1

## 2017-01-04 MED ORDER — OXYCODONE-ACETAMINOPHEN 5-325 MG PO TABS
2.0000 | ORAL_TABLET | ORAL | Status: DC | PRN
  Administered 2017-01-04 – 2017-01-06 (×8): 2 via ORAL
  Filled 2017-01-04 (×8): qty 2

## 2017-01-04 NOTE — Anesthesia Postprocedure Evaluation (Signed)
Anesthesia Post Note  Patient: [name]  Procedure(s) Performed: Procedure(s) (LRB): CESAREAN SECTION (N/A)  Patient location during evaluation: Mother Baby Anesthesia Type: Epidural Level of consciousness: awake, awake and alert and oriented Pain management: pain level controlled Vital Signs Assessment: post-procedure vital signs reviewed and stable Respiratory status: spontaneous breathing and nonlabored ventilation Cardiovascular status: blood pressure returned to baseline and stable Postop Assessment: no headache and no backache Anesthetic complications: no     Last Vitals:  Vitals:   01/04/17 0435 01/04/17 0754  BP: 137/76 (!) 118/54  Pulse: (!) 106 (!) 111  Resp: 20 18  Temp: 37.4 C 37.2 C    Last Pain:  Vitals:   01/04/17 0754  TempSrc: Oral  PainSc:                  [name]

## 2017-01-04 NOTE — Progress Notes (Signed)
  Subjective:   Doing well Post Op Day 1. Tolerating PO intake and pain with PO meds and On Q pump. IV fluids continue. Has not been up to ambulate yet. Catheter still in. Breastfeeding going well.  Objective:  Blood pressure (!) 118/54, pulse (!) 111, temperature 99 F (37.2 C), temperature source Oral, resp. rate 18, height 5\' 7"  (1.702 m), weight 210 lb (95.3 kg), SpO2 97 %, breastfeeding.  General: NAD Pulmonary: no increased work of breathing Abdomen: non-distended, non-tender, fundus firm at level of umbilicus Incision: C/D/I no s/s infection Extremities: no edema, no erythema, no tenderness  Results for orders placed or performed during the hospital encounter of 01/02/17 (from the past 24 hour(s))  CBC     Status: Abnormal   Collection Time: 01/04/17  5:35 AM  Result Value Ref Range   WBC 14.4 (H) 3.6 - 11.0 K/uL   RBC 3.14 (L) 3.80 - 5.20 MIL/uL   Hemoglobin 8.2 (L) 12.0 - 16.0 g/dL   HCT 16.124.6 (L) 09.635.0 - 04.547.0 %   MCV 78.2 (L) 80.0 - 100.0 fL   MCH 26.2 26.0 - 34.0 pg   MCHC 33.5 32.0 - 36.0 g/dL   RDW 40.914.1 81.111.5 - 91.414.5 %   Platelets 222 150 - 440 K/uL    Intake/Output Summary (Last 24 hours) at 01/04/17 0912 Last data filed at 01/04/17 [id number]  Gross per 24 hour  Intake             3172 ml  Output             3550 ml  Net             -378 ml     Assessment:   26 y.o. G2P1001 postoperativeday # 1   Plan:  1) Acute blood loss anemia - hemodynamically stable and asymptomatic - po ferrous sulfate  2) B positive, Rubella Immune, Varicella Immune  3) TDAP status: UTD  4) Breast/Contraception: Progesterone-only pill  5) Disposition: discharge to home day 2 or 3 depending on progress   Tresea MallJane Stanislav Gervase, CNM

## 2017-01-05 ENCOUNTER — Encounter: Payer: Self-pay | Admitting: Advanced Practice Midwife

## 2017-01-05 ENCOUNTER — Encounter: Payer: 59 | Admitting: Advanced Practice Midwife

## 2017-01-05 NOTE — Progress Notes (Signed)
  Subjective:  Doing well, still feels sore.  Tolerating po.  Minimal lochia  Objective:  Blood pressure 120/67, pulse 83, temperature 97.8 F (36.6 C), temperature source Oral, resp. rate 20, height 5\' 7"  (1.702 m), weight 210 lb (95.3 kg), SpO2 99 %, unknown if currently breastfeeding .  General: NAD Pulmonary: no increased work of breathing Abdomen: non-distended, non-tender, fundus firm at level of umbilicus Incision: D/C/I dressing Extremities: no edema, no erythema, no tenderness  Results for orders placed or performed during the hospital encounter of 01/02/17 (from the past 72 hour(s))  CBC     Status: Abnormal   Collection Time: 01/02/17  9:28 PM  Result Value Ref Range   WBC 15.4 (H) 3.6 - 11.0 K/uL   RBC 4.54 3.80 - 5.20 MIL/uL   Hemoglobin 11.7 (L) 12.0 - 16.0 g/dL   HCT 78.234.7 (L) 95.635.0 - 21.347.0 %   MCV 76.4 (L) 80.0 - 100.0 fL   MCH 25.7 (L) 26.0 - 34.0 pg   MCHC 33.6 32.0 - 36.0 g/dL   RDW 08.613.9 57.811.5 - 46.914.5 %   Platelets 332 150 - 440 K/uL  RPR     Status: None   Collection Time: 01/02/17  9:28 PM  Result Value Ref Range   RPR Ser Ql Non Reactive Non Reactive    Comment: (NOTE) Performed At: [facility] [address] [place], [place] [id number] Mila HomerHancock William F MD KG:[id number]:[phone]   Type and screen [facility]     Status: None   Collection Time: 01/02/17 10:28 PM  Result Value Ref Range   ABO/RH(D) B POS    Antibody Screen NEG    Sample Expiration 01/05/2017   CBC     Status: Abnormal   Collection Time: 01/04/17  5:35 AM  Result Value Ref Range   WBC 14.4 (H) 3.6 - 11.0 K/uL   RBC 3.14 (L) 3.80 - 5.20 MIL/uL   Hemoglobin 8.2 (L) 12.0 - 16.0 g/dL    Comment: RESULT REPEATED AND VERIFIED   HCT 24.6 (L) 35.0 - 47.0 %   MCV 78.2 (L) 80.0 - 100.0 fL   MCH 26.2 26.0 - 34.0 pg   MCHC 33.5 32.0 - 36.0 g/dL   RDW 64.414.1 03.411.5 - 74.214.5 %   Platelets 222 150 - 440 K/uL     Assessment:   26 y.o. G3P1001 postoperativeday # 2 1LTCS for fetal  intoelrance   Plan:  1) Acute blood loss anemia - hemodynamically stable and asymptomatic - po ferrous sulfate  2) Blood Type --/--/B POS (03/13 2228) / Rubella Immune / Varicella Immune  3) TDAP status UTD   4) Breast/Camila  5) Disposition anticipated POD3-4

## 2017-01-05 NOTE — Lactation Note (Signed)
This note was copied from a baby's chart. Lactation Consultation Note  Patient Name: [name] WUJWJ'XToday's Date: 01/05/2017 Reason for consult: Follow-up assessment Baby had circ this am, sleepy has not fed well today, pt states he did well yesterday, fussy at breast, ultimately had to swaddle him to calm him then able to latch with 20mm nipple shield, nipples flat but pull out with shield, baby pushes breast out with tongue unless breast is compressed to shape breast   Maternal Data Formula Feeding for Exclusion: No Does the patient have breastfeeding experience prior to this delivery?: No  Feeding Feeding Type: Breast Fed Length of feed: 15 min  LATCH Score/Interventions Latch: Repeated attempts needed to sustain latch, nipple held in mouth throughout feeding, stimulation needed to elicit sucking reflex. (baby extremly fussy at breast, gassy, can't coordinate suck,) Intervention(s): Adjust position;Assist with latch;Breast compression  Audible Swallowing: A few with stimulation Intervention(s): Skin to skin  Type of Nipple: Flat Intervention(s):  (nipple shield)  Comfort (Breast/Nipple): Filling, red/small blisters or bruises, mild/mod discomfort  Problem noted: Cracked, bleeding, blisters, bruises Interventions  (Cracked/bleeding/bruising/blister):  (coconut oil, gel pads)  Hold (Positioning): Assistance needed to correctly position infant at breast and maintain latch.  LATCH Score: 5  Lactation Tools Discussed/Used Tools: Nipple Shields Nipple shield size: 20 WIC Program: No   Consult Status Consult Status: Follow-up Date: 01/06/17 Follow-up type: In-patient    [name] 01/05/2017, 7:25 PM

## 2017-01-06 ENCOUNTER — Ambulatory Visit: Payer: Self-pay

## 2017-01-06 MED ORDER — OXYCODONE-ACETAMINOPHEN 5-325 MG PO TABS: 2.0000 | ORAL_TABLET | ORAL | 0 refills | Status: DC | PRN

## 2017-01-06 MED ORDER — IBUPROFEN 600 MG PO TABS: 600.0000 mg | ORAL_TABLET | Freq: Four times a day (QID) | ORAL | 0 refills | Status: DC | PRN

## 2017-01-06 MED ORDER — NORETHINDRONE 0.35 MG PO TABS: 1.0000 | ORAL_TABLET | Freq: Every day | ORAL | 11 refills | Status: DC

## 2017-01-06 NOTE — Progress Notes (Signed)
D/C home to car via auxiliary in wheelchair.  

## 2017-01-06 NOTE — Progress Notes (Signed)
D/C instructions provided, pt states understanding, aware of follow up appt. Prescriptions given to pt.   

## 2017-01-06 NOTE — Lactation Note (Signed)
This note was copied from a baby's chart. Lactation Consultation Note  Patient Name: [name] ZOXWR'UToday's Date: 01/06/2017  I saw this Mom during rounds this morning. She states that breastfeeding is going "well" and independent with using SNS and nipple shield, but also stated that she has nipple pain during the feeds and had been bleeding there as well. She declined LC assist even when I let her know that bleeding and that kind of pain even with a nipple shield should have a thorough assessment to decrease her pain and increase milk transfer to baby. [facility]. She seemed eager to go home and declined need for any supplies or teaching. She has LC contact  And BF support group info.    Maternal Data    Feeding    [facility]/Interventions                      Lactation Tools Discussed/Used     Consult Status      Sunday CornSandra Clark Uriel Dowding 01/06/2017, 3:52 PM

## 2017-01-11 ENCOUNTER — Ambulatory Visit (INDEPENDENT_AMBULATORY_CARE_PROVIDER_SITE_OTHER): Payer: 59 | Admitting: Obstetrics and Gynecology

## 2017-01-11 ENCOUNTER — Encounter: Payer: Self-pay | Admitting: Obstetrics and Gynecology

## 2017-01-11 VITALS — BP 112/72 | Wt 212.0 lb

## 2017-01-11 DIAGNOSIS — Z9889 Other specified postprocedural states: Secondary | ICD-10-CM

## 2017-01-11 NOTE — Progress Notes (Signed)
Patient ID: [name], female   DOB: 06/04/1991, 26 y.o.   MRN: [id number]     Postoperative Follow-up Patient presents post op from primary cesarean section 8days ago for fetal intolerance of labor.  Subjective: Eating a regular diet without difficulty. The patient is not having any pain.  Activity: physically active and is not limited by pain, but is restricting activities due to the proximity of her surgery.  Objective: Vital Signs: BP: 112/72 WT: 212lbs  Constitutional: Well nourished, well developed female in no acute distress.  HEENT: normal Skin: Warm and dry.  Extremity: no edema  Abdomen: Soft, non-tender, normal bowel sounds; no bruits, organomegaly or masses. Incision: clean, dry, intact without erythema, induration, warmth, and tenderness   Assessment: 26 y.o. s/p primary cesarean section for fetal intolerance of labor, stable  Plan: Patient has done well after surgery with no apparent complications.  I have discussed the post-operative course to date, and the expected progress moving forward.  The patient understands what complications to be concerned about.  I will see the patient in routine follow up, or sooner if needed.    Activity plan: gradually increase activities. Wound care instructions discussed.   Follow up 5 weeks for 6 weeks pospartum visit.   Thomasene MohairStephen Urvi Imes 01/11/2017, 2:59 PM

## 2017-02-15 ENCOUNTER — Ambulatory Visit (INDEPENDENT_AMBULATORY_CARE_PROVIDER_SITE_OTHER): Payer: 59 | Admitting: Obstetrics and Gynecology

## 2017-02-15 ENCOUNTER — Encounter: Payer: Self-pay | Admitting: Obstetrics and Gynecology

## 2017-02-15 MED ORDER — NORGESTIMATE-ETH ESTRADIOL 0.25-35 MG-MCG PO TABS: 1.0000 | ORAL_TABLET | Freq: Every day | ORAL | 4 refills | Status: DC

## 2017-02-15 NOTE — Progress Notes (Signed)
Postpartum Visit   Chief Complaint  Patient presents with  . 6 week PostPartum    History of Present Illness: Patient is a 26 y.o. G1P1001 presents for postpartum visit.  Date of delivery: 01/03/17 Type of delivery: cesarean section Pregnancy or labor problems:  Fetal intolerance of labor, obesity (BMI 30s) Any problems since the delivery:  no  Newborn Details:  SINGLETON :  1. Baby's name: Raylan. Birth weight: 8 lb 13 oz Maternal Details:  Breast Feeding:  no Post partum depression/anxiety noted:  no Edinburgh Post-Partum Depression Score:  0  Date of last PAP: 12/13/15  normal   Review of Systems: Review of Systems  Constitutional: Negative.   HENT: Negative.   Eyes: Negative.   Respiratory: Negative.   Cardiovascular: Negative.   Gastrointestinal: Negative.   Genitourinary: Negative.   Musculoskeletal: Negative.   Skin: Negative.   Neurological: Negative.   Psychiatric/Behavioral: Negative.     Past Medical History:  Past Medical History:  Diagnosis Date  . Kidney stone 2012  . Menorrhagia   . Polycystic ovarian disease   . UTI (lower urinary tract infection)     Past Surgical History:  Past Surgical History:  Procedure Laterality Date  . CESAREAN SECTION N/A 01/03/2017   Procedure: CESAREAN SECTION;  Surgeon: Conard Novak, MD;  Location: [facility] ORS;  Service: Obstetrics;  Laterality: N/A;  . NO PAST SURGERIES    . WISDOM TOOTH EXTRACTION      Family History:  Family History  Problem Relation Age of Onset  . Heart disease Maternal Grandmother   . Thyroid cancer Maternal Grandmother   . Diabetes type II Paternal Grandfather   . Arthritis Paternal Grandmother   . Hypertension Paternal Grandmother   . Diabetes Paternal Grandmother     Social History:  Social History   Social History  . Marital status: Married    Spouse name: Amalia Hailey  . Number of children: N/A  . Years of education: N/A   Occupational History  . 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 Smoking status: Never Smoker  . Smokeless tobacco: Never Used  . Alcohol use No  . Drug use: No  . Sexual activity: Yes   Other Topics Concern  . 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    Allergies:  No Known Allergies  Medications: Prior to Admission medications   Medication Sig Start Date End Date Taking? Authorizing Provider  norethindrone (MICRONOR,CAMILA,ERRIN) 0.35 MG tablet Take 1 tablet (0.35 mg total) by mouth daily. 01/06/17   Conard Novak, MD  Prenatal Vit-Fe Fumarate-FA (MULTIVITAMIN-PRENATAL) 27-0.8 MG TABS tablet Take 1 tablet by mouth daily at 12 noon.    Historical Provider, MD    Physical Exam BP 118/72   Ht  (1.626 m)   Wt 215 lb (97.5 kg)   LMP 02/08/2017   BMI 36.90 kg/m   Physical Exam  Constitutional: She is oriented to person, place, and time. She appears well-developed and well-nourished. No distress.  Genitourinary: Uterus normal. Pelvic exam was performed with patient supine. There is no rash, tenderness, lesion or injury on the right labia. There is no rash, tenderness, lesion or injury on the left labia. No erythema, tenderness or bleeding in the vagina. No signs of injury around the vagina. No vaginal discharge found. Right adnexum  does not display mass, does not display tenderness and does not display fullness. Left adnexum does not display mass, does not display tenderness and does not display fullness. Cervix does not exhibit motion tenderness or discharge.   Uterus is mobile and anteverted. Uterus is not enlarged, tender or exhibiting a mass.  HENT:  Head: Normocephalic and atraumatic.  Eyes: EOM are normal. No scleral icterus.  Neck: Normal range of motion. Neck supple. No thyromegaly present.  Cardiovascular: Normal rate and regular rhythm.  Exam reveals no gallop and no friction rub.   No murmur  heard. Pulmonary/Chest: Effort normal and breath sounds normal. No respiratory distress. She has no wheezes. She has no rales.  Abdominal: Soft. Bowel sounds are normal. She exhibits no distension and no mass. There is no tenderness. There is no rebound and no guarding.  Well-healed incision without erythema, induration, warmth, and tenderness  Musculoskeletal: Normal range of motion. She exhibits no edema or tenderness.  Lymphadenopathy:    She has no cervical adenopathy.       Right: No inguinal adenopathy present.       Left: No inguinal adenopathy present.  Neurological: She is alert and oriented to person, place, and time. No cranial nerve deficit.  Skin: Skin is warm and dry. No rash noted. No erythema.  Psychiatric: She has a normal mood and affect. Her behavior is normal. Judgment normal.     Female Chaperone present during breast and/or pelvic exam.  Assessment: 26 y.o. G1P1001 presenting for 6 week postpartum visit, doing well  Plan:  1) Contraception Education given regarding options for contraception, including oral contraceptives. Risks, benefits, and alternatives discussed.   2)  Pap - ASCCP guidelines and rational discussed.  Patient opts for routine screening interval  3) Patient underwent screening for postpartum depression with no concerns noted.  4) Follow up 1 year for routine annual exam   Thomasene Mohair, MD 02/15/2017 2:05 PM

## 2017-11-23 ENCOUNTER — Other Ambulatory Visit: Payer: Self-pay | Admitting: Obstetrics and Gynecology

## 2017-11-23 MED ORDER — NORGESTIMATE-ETH ESTRADIOL 0.25-35 MG-MCG PO TABS: 1.0000 | ORAL_TABLET | Freq: Every day | ORAL | 4 refills | Status: DC

## 2018-12-05 ENCOUNTER — Ambulatory Visit (INDEPENDENT_AMBULATORY_CARE_PROVIDER_SITE_OTHER): Payer: Managed Care, Other (non HMO) | Admitting: Obstetrics and Gynecology

## 2018-12-05 ENCOUNTER — Encounter: Payer: Self-pay | Admitting: Obstetrics and Gynecology

## 2018-12-05 ENCOUNTER — Other Ambulatory Visit (HOSPITAL_COMMUNITY)
Admission: RE | Admit: 2018-12-05 | Discharge: 2018-12-05 | Disposition: A | Discharge status: Home / Self Care | Payer: Managed Care, Other (non HMO) | Source: Ambulatory Visit | Attending: Obstetrics and Gynecology | Admitting: Obstetrics and Gynecology

## 2018-12-05 VITALS — BP 118/68 | Ht 64.0 in | Wt 231.0 lb

## 2018-12-05 DIAGNOSIS — N760 Acute vaginitis: Secondary | ICD-10-CM | POA: Diagnosis present

## 2018-12-05 NOTE — Progress Notes (Signed)
Obstetrics & Gynecology Office Visit   Chief Complaint  Patient presents with  . Vaginitis    little bit of discharge, itching, has been couple days    History of Present Illness: [name] is a 28 y.o. G1P1001 who LMP was Patient's last menstrual period was 10/16/2018 (exact date)., presents today for a problem visit.   Patient complains of an abnormal vaginal discharge for 2 days. Discharge described as: white and odorless. Vaginal symptoms include vulvar itching and vaginal itching.Vulvar symptoms include vulvar itching.STI Risk: Very low risk of STD exposure.   Other associated symptoms: pressure type of pain, just "down there.".Menstrual pattern: She had been bleeding irregularly. Contraception: OCP (estrogen/progesterone).  She reports  recent antibiotic exposure to  Flagyl for BV, denies  changes in soaps, detergents coinciding with the onset of her symptoms.  She has not previously self treated or been under treatment by another provider for these symptoms.    Past Medical History:  Diagnosis Date  . Kidney stone 2012  . Menorrhagia   . Polycystic ovarian disease   . UTI (lower urinary tract infection)     Past Surgical History:  Procedure Laterality Date  . CESAREAN SECTION N/A 01/03/2017   Procedure: CESAREAN SECTION;  Surgeon: Conard Novak, MD;  Location: [facility] ORS;  Service: Obstetrics;  Laterality: N/A;  . NO PAST SURGERIES    . WISDOM TOOTH EXTRACTION      Gynecologic History: Patient's last menstrual period was 10/16/2018 (exact date).  Obstetric History: G1P1001  Family History  Problem Relation Age of Onset  . Heart disease Maternal Grandmother   . Thyroid cancer Maternal Grandmother   . Diabetes type II Paternal Grandfather   . Arthritis Paternal Grandmother   . Hypertension Paternal Grandmother   . Diabetes Paternal Grandmother     Social History   Socioeconomic History  . Marital status: Married    Spouse name: [name]  . Number of  children: Not on file  . Years of education: Not on file  . Highest education level: Not on file  Occupational History  . Occupation: Airline pilot: [employer]    Comment: mgr  Social Needs  . Financial resource strain: Not on file  . Food insecurity:    Worry: Not on file    Inability: Not on file  . Transportation needs:    Medical: Not on file    Non-medical: Not on file  Tobacco Use  . Smoking status: Never Smoker  . Smokeless tobacco: Never Used  Substance and Sexual Activity  . Alcohol use: No  . Drug use: No  . Sexual activity: Yes  Lifestyle  . Physical activity:    Days per week: Not on file    Minutes per session: Not on file  . Stress: Not on file  Relationships  . Social connections:    Talks on phone: Not on file    Gets together: Not on file    Attends religious service: Not on file    Active member of club or organization: Not on file    Attends meetings of clubs or organizations: Not on file    Relationship status: Not on file  . Intimate partner violence:    Fear of current or ex partner: Not on file    Emotionally abused: Not on file    Physically abused: Not on file    Forced sexual activity: Not on file  Other Topics Concern  . 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    No Known Allergies  Prior to Admission medications   Medication Sig Start Date End Date Taking? Authorizing Provider  acetaminophen (TYLENOL) 500 MG tablet Take 500 mg by mouth every 6 (six) hours as needed (back pain).    [provider]  calcium carbonate (TUMS) 500 MG chewable tablet Chew 1 tablet by mouth daily.    [provider]  ibuprofen (ADVIL,MOTRIN) 600 MG tablet Take 1 tablet (600 mg total) by mouth every 6 (six) hours as needed for mild pain. Patient not taking: Reported on 12/05/2018 01/06/17   Conard NovakJackson, Chandni Gagan D, MD  norgestimate-ethinyl estradiol  (ORTHO-CYCLEN,SPRINTEC,PREVIFEM) 0.25-35 MG-MCG tablet Take 1 tablet by mouth daily. 11/23/17 02/15/18  Conard NovakJackson, Tiffanee Mcnee D, MD  oxyCODONE-acetaminophen (PERCOCET/ROXICET) 5-325 MG tablet Take 2 tablets by mouth every 4 (four) hours as needed (breakthrough pain). Patient not taking: Reported on 12/05/2018 01/06/17   Conard NovakJackson, Malyn Aytes D, MD  Prenatal Vit-Fe Fumarate-FA (MULTIVITAMIN-PRENATAL) 27-0.8 MG TABS tablet Take 1 tablet by mouth daily at 12 noon.    [provider]    Review of Systems  Constitutional: Negative.   HENT: Negative.   Eyes: Negative.   Respiratory: Negative.   Cardiovascular: Negative.   Gastrointestinal: Negative.   Genitourinary: Negative.        See HPI  Musculoskeletal: Negative.   Skin: Negative.   Neurological: Negative.   Psychiatric/Behavioral: Negative.      Physical Exam BP 118/68   Ht 5\' 4"  (1.626 m)   Wt 231 lb (104.8 kg)   LMP 10/16/2018 (Exact Date)   BMI 39.65 kg/m  Patient's last menstrual period was 10/16/2018 (exact date). Physical Exam Constitutional:      General: She is not in acute distress.    Appearance: Normal appearance. She is well-developed.  Genitourinary:     Pelvic exam was performed with patient supine.     Vulva, inguinal canal, urethra, bladder, vagina, uterus, right adnexa and left adnexa normal.     No posterior fourchette tenderness, injury or lesion present.     No cervical friability, lesion, bleeding or polyp.  HENT:     Head: Normocephalic and atraumatic.  Eyes:     General: No scleral icterus.    Conjunctiva/sclera: Conjunctivae normal.  Neck:     Musculoskeletal: Normal range of motion and neck supple.  Cardiovascular:     Rate and Rhythm: Normal rate and regular rhythm.     Heart sounds: No murmur. No friction rub. No gallop.   Pulmonary:     Effort: Pulmonary effort is normal. No respiratory distress.     Breath sounds: Normal breath sounds. No wheezing or rales.  Abdominal:     General: Bowel  sounds are normal. There is no distension.     Palpations: Abdomen is soft. There is no mass.     Tenderness: There is no abdominal tenderness. There is no guarding or rebound.  Musculoskeletal: Normal range of motion.  Neurological:     General: No focal deficit present.     Mental Status: She is alert and oriented to person, place, and time.     Cranial Nerves: No cranial nerve deficit.  Skin:    General: Skin is warm and dry.     Findings: No erythema.  Psychiatric:        Mood and Affect: Mood normal.  Behavior: Behavior normal.        Judgment: Judgment normal.    Female chaperone present for pelvic and breast  portions of the physical exam  Wet Prep: PH: 5.0 Clue Cells: Negative Fungal elements: Negative Trichomonas: Negative   Assessment: 28 y.o. G11P1001 female here for  1. Acute vaginitis      Plan: Problem List Items Addressed This Visit    None    Visit Diagnoses    Acute vaginitis    -  Primary   Relevant Orders   Cervicovaginal ancillary only     Discussed findings.  Will send Aptima for culture.  15 minutes spent in face to face discussion with > 50% spent in counseling,management, and coordination of care of her acute vaginitis.   Thomasene Mohair, MD 12/06/2018 12:56 PM

## 2018-12-06 ENCOUNTER — Encounter: Payer: Self-pay | Admitting: Obstetrics and Gynecology

## 2018-12-10 LAB — CERVICOVAGINAL ANCILLARY ONLY
Bacterial vaginitis: NEGATIVE
Candida vaginitis: NEGATIVE
Chlamydia: NEGATIVE
Neisseria Gonorrhea: NEGATIVE
TRICH (WINDOWPATH): NEGATIVE

## 2018-12-29 ENCOUNTER — Other Ambulatory Visit: Payer: Self-pay | Admitting: Obstetrics and Gynecology

## 2018-12-29 DIAGNOSIS — L292 Pruritus vulvae: Secondary | ICD-10-CM

## 2018-12-29 MED ORDER — TRIAMCINOLONE ACETONIDE 0.1 % EX OINT: 1.0000 "application " | TOPICAL_OINTMENT | Freq: Two times a day (BID) | CUTANEOUS | 0 refills | Status: DC

## 2019-01-25 ENCOUNTER — Other Ambulatory Visit: Payer: Self-pay | Admitting: Obstetrics and Gynecology

## 2019-01-27 NOTE — Telephone Encounter (Signed)
advise

## 2019-03-04 ENCOUNTER — Telehealth: Payer: Self-pay | Admitting: Family Medicine

## 2019-03-04 NOTE — Telephone Encounter (Signed)
Called and left vm. Calling to verify PCP. Patient was last seen in 2015 by Dr. Dayton Martes.

## 2019-05-02 ENCOUNTER — Ambulatory Visit: Payer: Managed Care, Other (non HMO) | Admitting: Obstetrics and Gynecology

## 2019-06-26 ENCOUNTER — Other Ambulatory Visit: Payer: Self-pay

## 2019-06-26 DIAGNOSIS — Z20822 Contact with and (suspected) exposure to covid-19: Secondary | ICD-10-CM

## 2019-06-27 LAB — NOVEL CORONAVIRUS, NAA: SARS-CoV-2, NAA: DETECTED — AB

## 2020-04-14 ENCOUNTER — Telehealth: Payer: Self-pay | Admitting: General Practice

## 2020-04-14 NOTE — Telephone Encounter (Signed)
Removed Dr. Dayton Martes as patient's PCP due to her leaving this practice and patient has not been seen by her in 6 years.

## 2020-12-16 ENCOUNTER — Encounter: Payer: Managed Care, Other (non HMO) | Admitting: *Deleted

## 2020-12-16 ENCOUNTER — Other Ambulatory Visit: Payer: Self-pay

## 2020-12-16 ENCOUNTER — Encounter: Payer: Self-pay | Admitting: *Deleted

## 2020-12-16 VITALS — BP 108/64 | Ht 65.0 in | Wt 227.5 lb

## 2020-12-16 DIAGNOSIS — Z794 Long term (current) use of insulin: Secondary | ICD-10-CM | POA: Insufficient documentation

## 2020-12-16 DIAGNOSIS — O24111 Pre-existing diabetes mellitus, type 2, in pregnancy, first trimester: Secondary | ICD-10-CM | POA: Diagnosis not present

## 2020-12-16 NOTE — Progress Notes (Signed)
Diabetes Self-Management Education  Visit Type: First/Initial  Appt. Start Time: 0840 Appt. End Time: 1000  12/16/2020  [name], identified by name and date of birth, is a 30 y.o. female with a diagnosis of Diabetes: Type 2 (Pregnant).   ASSESSMENT  Blood pressure 108/64, height 5\' 5"  (1.651 m), weight 227 lb 8 oz (103.2 kg), last menstrual period 10/15/2020, estimated date of delivery 07/22/2021. Body mass index is 37.86 kg/m.   Diabetes Self-Management Education - 12/16/20 1103      Visit Information   Visit Type First/Initial      Initial Visit   Diabetes Type Type 2   Pregnant   Are you currently following a meal plan? Yes    What type of meal plan do you follow? "watching the food I eat; eating ffods lower in sugars and watching carbs"    Are you taking your medications as prescribed? Yes    Date Diagnosed Type 2 - almost 2 years ago      Health Coping   How would you rate your overall health? Good      Psychosocial Assessment   Patient Belief/Attitude about Diabetes Motivated to manage diabetes   "not bothering me"   Self-care barriers None    Self-management support Doctor's office;Family    Patient Concerns Nutrition/Meal planning;Glycemic Control;Healthy Lifestyle    Special Needs None    Preferred Learning Style Hands on    Learning Readiness Change in progress    How often do you need to have someone help you when you read instructions, pamphlets, or other written materials from your doctor or pharmacy? 1 - Never    What is the last grade level you completed in school? high school      Pre-Education Assessment   Patient understands the diabetes disease and treatment process. Needs Instruction    Patient understands incorporating nutritional management into lifestyle. Needs Review    Patient undertands incorporating physical activity into lifestyle. Needs Instruction    Patient understands using medications safely. Needs Instruction    Patient  understands monitoring blood glucose, interpreting and using results Needs Review    Patient understands prevention, detection, and treatment of acute complications. Needs Instruction    Patient understands prevention, detection, and treatment of chronic complications. Needs Review    Patient understands how to develop strategies to address psychosocial issues. Needs Instruction    Patient understands how to develop strategies to promote health/change behavior. Needs Instruction      Complications   Last HgB A1C per patient/outside source 6.7 %   11/16/2020   How often do you check your blood sugar? 3-4 times/day    Fasting Blood glucose range (mg/dL) 11/18/2020   All FBG's above guidelines = 122-160 mg/dL.   Postprandial Blood glucose range (mg/dL) 07-622;633-354   All pp's above guidelines = pp breakfast 180-195 mg/dL; pp lunch 562-563;893-734 mg/dL; pp supper 287-681 mg/dL.   Have you had a dilated eye exam in the past 12 months? No    Have you had a dental exam in the past 12 months? No    Are you checking your feet? Yes    How many days per week are you checking your feet? 7      Dietary Intake   Breakfast yogurt (not 157-262) and fruit (apple, watermelon, banana)    Snack (morning) 1-2 snacks/day - cheese and crackers; fruit    Lunch soup, wrap, pizza    Snack (afternoon) popcorn    Dinner eats little  meat - some beef and chicken; potatoes, beans, rice, pasta, green beans, lettuce cheese bacon on salads    Beverage(s) water, deit soda      Exercise   Exercise Type Light (walking / raking leaves)    How many days per week to you exercise? 2.5    How many minutes per day do you exercise? 30    Total minutes per week of exercise 75      Patient Education   Previous Diabetes Education Yes (please comment)   primary doctor   Disease state  Definition of diabetes, type 1 and 2, and the diagnosis of diabetes;Factors that contribute to the development of diabetes    Nutrition management   Role of diet in the treatment of diabetes and the relationship between the three main macronutrients and blood glucose level;Food label reading, portion sizes and measuring food.;Reviewed blood glucose goals for pre and post meals and how to evaluate the patients' food intake on their blood glucose level.;Meal timing in regards to the patients' current diabetes medication.    Physical activity and exercise  Role of exercise on diabetes management, blood pressure control and cardiac health.    Medications Taught/reviewed insulin injection, site rotation, insulin storage and needle disposal.;Reviewed patients medication for diabetes, action, purpose, timing of dose and side effects. Pt injected 5 units NS to left abdomen subcutaneously without difficulty.   Monitoring Purpose and frequency of SMBG.;Taught/discussed recording of test results and interpretation of SMBG.;Identified appropriate SMBG and/or A1C goals.;Ketone testing, when, how.    Acute complications Taught treatment of hypoglycemia - the 15 rule.    Chronic complications Relationship between chronic complications and blood glucose control    Psychosocial adjustment Identified and addressed patients feelings and concerns about diabetes    Preconception care Pregnancy and GDM  Role of pre-pregnancy blood glucose control on the development of the fetus;Reviewed with patient blood glucose goals with pregnancy;Role of family planning for patients with diabetes      Individualized Goals (developed by patient)   Reducing Risk Other (comment)   improve blood sugars; lead a healthier lifestyle     Outcomes   Expected Outcomes Demonstrated interest in learning. Expect positive outcomes    Future DMSE 2 wks           Individualized Plan for Diabetes Self-Management Training:   Learning Objective:  Patient will have a greater understanding of diabetes self-management. Patient education plan is to attend individual and/or group sessions per  assessed needs and concerns.   Plan:   Patient Instructions  Read booklet on Gestational Diabetes Follow Gestational Meal Planning Guidelines Avoid fruit juices unless treating a low blood sugar Avoid fruit at breakfast if blood sugars elevated Always carry fast acting glucose and a snack Complete a 3 Day Food Record and bring to next appointment Check blood sugars 4 x day - before breakfast and 2 hrs after every meal and record  Bring blood sugar log to all appointments Purchase urine ketone strips if instructed by MD and check urine ketones every am:  If + increase bedtime snack to 1 protein and 2 carbohydrate servings Walk 20-30 minutes at least 5 x week if permitted by MD Rotate injection sites Give am insulin 30 minutes before breakfast and pm insulin 30 minutes before supper  Morning insulin Humulin/Novolin    N  (cloudy)      20 units Humulin/Novolin    R  (clear)       10   units  30   units  Evening insulin Humulin/Novolin    N (cloudy)      8      units Humulin/Novolin    R (clear)       8      units                   16      units  Expected Outcomes:  Demonstrated interest in learning. Expect positive outcomes  Education material provided:  Diabetes Management for Mothers-To-Be Booklet Gestational Meal Planning Guidelines Simple Meal Plan 3 Day Food Record Goals for a Healthy Pregnancy Injection Guide (BD) Glucose tablets Symptoms, causes and treatments of Hypoglycemia  If problems or questions, patient to contact team via:  Sharion Settler, RN, CCM, CDCES [phone]  Future DSME appointment: 2 wks  December 30, 2020 with the dietitian

## 2020-12-16 NOTE — Patient Instructions (Addendum)
Read booklet on Gestational Diabetes Follow Gestational Meal Planning Guidelines Avoid fruit juices unless treating a low blood sugar Avoid fruit at breakfast if blood sugars elevated Always carry fast acting glucose and a snack Complete a 3 Day Food Record and bring to next appointment Check blood sugars 4 x day - before breakfast and 2 hrs after every meal and record  Bring blood sugar log to all appointments Purchase urine ketone strips if instructed by MD and check urine ketones every am:  If + increase bedtime snack to 1 protein and 2 carbohydrate servings Walk 20-30 minutes at least 5 x week if permitted by MD Rotate injection sites Give am insulin 30 minutes before breakfast and pm insulin 30 minutes before supper   Morning insulin Humulin/Novolin    N  (cloudy)      20 units Humulin/Novolin    R  (clear)       10   units                                             30   units  Evening insulin Humulin/Novolin    N (cloudy)      8      units Humulin/Novolin    R (clear)       8      units                   16      units

## 2020-12-30 ENCOUNTER — Other Ambulatory Visit: Payer: Self-pay

## 2020-12-30 ENCOUNTER — Encounter: Payer: Self-pay | Admitting: Dietician

## 2020-12-30 ENCOUNTER — Encounter: Payer: Managed Care, Other (non HMO) | Admitting: Dietician

## 2020-12-30 VITALS — BP 124/84 | Ht 65.0 in | Wt 227.3 lb

## 2020-12-30 DIAGNOSIS — O24111 Pre-existing diabetes mellitus, type 2, in pregnancy, first trimester: Secondary | ICD-10-CM | POA: Insufficient documentation

## 2020-12-30 NOTE — Progress Notes (Signed)
.   Patient's BG record indicates fasting BGs ranging 97-111, and post-meal BGs ranging 115-168. She denies any episodes of hypoglycemia, but does have a supply of glucose tablets. . Patient's food diary indicates she is eating at regular intervals; no evening snack but supper meal is about 2 hours before bedtime. She is including protein sources with all meals and seems to be controlling carbohydrate intake carefully.   . Provided balanced meal plan, and wrote individualized menus based on patient's food preferences. Reviewed meal planning process and goal for carb intake, roles of protein and fat, flexibility of protein and vegetable food portions to help with satiety. . Instructed patient on food safety, including avoidance of Listeriosis, and limiting mercury from fish. . Discussed importance of maintaining healthy lifestyle habits. Discussed potential for shifts in BG control during pregnancy.

## 2020-12-30 NOTE — Patient Instructions (Signed)
   Great job planning and eating balanced meals! Continue to include protein foods with each meal, and plan for a veg or fruit or both. Control carbs to 30-45grams per meal.   Keep up regular light exercise

## 2021-01-03 DIAGNOSIS — O24111 Pre-existing diabetes mellitus, type 2, in pregnancy, first trimester: Secondary | ICD-10-CM | POA: Insufficient documentation

## 2021-01-03 DIAGNOSIS — O2413 Pre-existing diabetes mellitus, type 2, in the puerperium: Secondary | ICD-10-CM | POA: Insufficient documentation

## 2021-01-08 ENCOUNTER — Other Ambulatory Visit: Payer: Self-pay | Admitting: Obstetrics & Gynecology

## 2021-01-08 DIAGNOSIS — O10011 Pre-existing essential hypertension complicating pregnancy, first trimester: Secondary | ICD-10-CM

## 2021-01-08 DIAGNOSIS — O24111 Pre-existing diabetes mellitus, type 2, in pregnancy, first trimester: Secondary | ICD-10-CM

## 2021-01-08 DIAGNOSIS — O99211 Obesity complicating pregnancy, first trimester: Secondary | ICD-10-CM

## 2021-01-11 ENCOUNTER — Ambulatory Visit: Payer: Managed Care, Other (non HMO)

## 2021-01-18 ENCOUNTER — Ambulatory Visit: Payer: Managed Care, Other (non HMO) | Attending: Maternal & Fetal Medicine

## 2021-01-18 ENCOUNTER — Ambulatory Visit (HOSPITAL_BASED_OUTPATIENT_CLINIC_OR_DEPARTMENT_OTHER): Payer: Managed Care, Other (non HMO) | Admitting: Maternal & Fetal Medicine

## 2021-01-18 ENCOUNTER — Other Ambulatory Visit: Payer: Self-pay

## 2021-01-18 DIAGNOSIS — O10011 Pre-existing essential hypertension complicating pregnancy, first trimester: Secondary | ICD-10-CM

## 2021-01-18 DIAGNOSIS — Z794 Long term (current) use of insulin: Secondary | ICD-10-CM

## 2021-01-18 DIAGNOSIS — Z3A13 13 weeks gestation of pregnancy: Secondary | ICD-10-CM | POA: Insufficient documentation

## 2021-01-18 DIAGNOSIS — O99211 Obesity complicating pregnancy, first trimester: Secondary | ICD-10-CM | POA: Diagnosis not present

## 2021-01-18 DIAGNOSIS — E669 Obesity, unspecified: Secondary | ICD-10-CM | POA: Diagnosis not present

## 2021-01-18 DIAGNOSIS — O24111 Pre-existing diabetes mellitus, type 2, in pregnancy, first trimester: Secondary | ICD-10-CM | POA: Diagnosis not present

## 2021-01-18 DIAGNOSIS — E119 Type 2 diabetes mellitus without complications: Secondary | ICD-10-CM | POA: Diagnosis not present

## 2021-01-18 NOTE — Progress Notes (Signed)
MFM Consultation  Date of Service: 01/18/21 Reason: T2DM and Obesity in pregnancy. Referring provider: Dr. Vikki Ports Ward  [name] is a 62 G2P1 who is here in consultation regarding the above note indications at 13w4 d at the request of Dr. Leonides Schanz.  She is doing well today. She denies s/sx of bleeding or cramping. She has low risk Mat 21.   Her pregnancy is complicated by M0EY in which she was diagnosed 1 year ago. She is under the care of her OB/GYN providers.  She has a clear understanding of her blood sugar goals and is working toward those goals. Recently switched from oral therapy to insulin.   Her initial HbA1c was 6.7% She has normal prenatal labs including, CBC and CMP. She is taking low dose ASA daily.  She had an EKG that was normal per [name]'s report but has not had an eye exam.   Her insulin regimen includes:   Morning insulin Humulin/Novolin    N  (cloudy)      24   units Humulin/Novolin    R  (clear)        14   units                                                      38   units Evening insulin Humulin/Novolin    N (cloudy)       12     units Humulin/Novolin    R (clear)         12    units                                                      24     Vitals with BMI 01/18/2021 12/30/2020 12/16/2020  Height '5\' 4"'  '5\' 5"'  '5\' 5"'   Weight 227 lbs 8 oz 227 lbs 5 oz 227 lbs 8 oz  BMI 39.03 22.33 61.22  Systolic 449 753 005  Diastolic 76 84 64  Pulse 98 - -     CBC Latest Ref Rng & Units 01/04/2017 01/02/2017 10/20/2016  WBC 3.6 - 11.0 K/uL 14.4(H) 15.4(H) -  Hemoglobin 12.0 - 16.0 g/dL 8.2(L) 11.7(L) 11.0  Hematocrit 35.0 - 47.0 % 24.6(L) 34.7(L) 34  Platelets 150 - 440 K/uL 222 332 324   CMP Latest Ref Rng & Units 12/15/2013  Glucose 65 - 99 mg/dL 195(H)  BUN 6 - 20 mg/dL 8  Creatinine 0.57 - 1.00 mg/dL 0.69  Sodium 134 - 144 mmol/L 142  Potassium 3.5 - 5.2 mmol/L 4.2  Chloride 97 - 108 mmol/L 103  CO2 18 - 29 mmol/L 20  Calcium 8.7 - 10.2 mg/dL 9.8  Total  Protein 6.0 - 8.5 g/dL 6.8  Total Bilirubin 0.0 - 1.2 mg/dL <0.2  Alkaline Phos 39 - 117 IU/L 60  AST 0 - 40 IU/L 16  ALT 0 - 32 IU/L 19   OB History  Gravida Para Term Preterm AB Living  '2 1 1 ' 0 0 1  SAB IAB Ectopic Multiple Live Births  0 0 0 0 1    # Outcome Date GA Lbr Len/2nd Weight Sex Delivery Anes PTL Lv  2 Current           1 Term 01/03/17 [redacted]w[redacted]d 4000 g M CS-LTranv EPI  LIV     Name: Seminara,BOY Cataleyah     Apgar1: 8  Apgar5: 9   Past Medical History:  Diagnosis Date  . Diabetes mellitus [place] complication ([place]   . Kidney stone 2012  . Menorrhagia   . Polycystic ovarian disease   . UTI (lower urinary tract infection)    Past Surgical History:  Procedure Laterality Date  . CESAREAN SECTION N/A 01/03/2017   Procedure: CESAREAN SECTION;  Surgeon: SWill Bonnet MD;  Location: [facility] ORS;  Service: Obstetrics;  Laterality: N/A;  . NO PAST SURGERIES    . WISDOM TOOTH EXTRACTION     Family History  Problem Relation Age of Onset  . Heart disease Maternal Grandmother   . Thyroid cancer Maternal Grandmother   . Diabetes type II Paternal Grandfather   . Arthritis Paternal Grandmother   . Hypertension Paternal Grandmother   . Diabetes Paternal Grandmother   . Diabetes Mother    Outpatient Medications Prior to Visit  Medication Sig  . acetaminophen (TYLENOL) 500 MG tablet Take 500 mg by mouth every 6 (six) hours as needed (back pain).  .Marland Kitchenazelastine (ASTELIN) 0.1 % nasal spray Place 1 spray into the nose 2 (two) times daily as needed.  . calcium carbonate (TUMS - DOSED IN MG ELEMENTAL CALCIUM) 500 MG chewable tablet Chew 1 tablet by mouth daily.  . cetirizine (ZYRTEC) 10 MG tablet Take 10 mg by mouth daily.  . fluticasone (FLONASE) 50 MCG/ACT nasal spray Place 2 sprays into the nose daily as needed.  . insulin NPH Human (NOVOLIN N) 100 UNIT/ML injection Inject 20 units in AM/8 units in PM  . insulin regular (NOVOLIN R) 100 units/mL injection Inject 10 units in AM/8  units in PM  . metFORMIN (GLUCOPHAGE) 500 MG tablet Take 1,000 mg by mouth 2 (two) times daily.  . Prenatal Vit-Fe Fumarate-FA (MULTIVITAMIN-PRENATAL) 27-0.8 MG TABS tablet Take 1 tablet by mouth daily at 12 noon.  . propranolol (INDERAL) 20 MG tablet Take 20 mg by mouth 2 (two) times daily as needed.   No facility-administered medications prior to visit.   No Known Allergies   Impression/Counseling: I reviewed with [name] the normal nature of today's ultrasound. The exam was normal for a first trimester anatomy. However, due to early gestation a detailed exam cannot be performed.   Type 2 DM: Ms. MParisrecently met with her diabetic educator. She is overall doing well as note above.   We discussed the increased risk of preeclampsia, fetal macrosomia, cesarean delivery, birth trauma including nerve palsy if a shoulder dystocia occurs, and neonatal admission due hypoglycemia. Lastly, pregnancies affected by diabetes are at an increased risk for cardiac anomalies. Her early pregnancy HbA1c was 6.7%  We discussed the mainstay of blood sugar management to include nutrition, exercise and medical therapy.  I reviewed the goals of blood sugar management to include FBS < 90 mg/dL and 2hr pp < 140 mg/dL   She is currently taking the above noted insulin therapy which appears to effective for her at this time.   Given the increased risks as noted above we recommend a detailed anatomy at 20-22 weeks, serial growth exams at 24/28/32/36 weeks. Initiate weekly testing at 32 weeks. Lastly for preeclampsia prevention we recommend her continue her daily low dose ASA.  She had a normal CBC and CMP. I would recommend a  UPC. And Ophthalmology exam at her earliest convenience.    Obesity:  Ms. Arens has a BMI of 37 kg/m3 . I discussed that an increased BMI is associated with an increased risk for gestational diabetes, fetal macrosomia, preeclampsia, fetal macrosomia, cesarean delivery and thrombosis  in pregnancy. We discussed the fetal weight gain goal of 11-20 lbs. We reviewed the strategy of avoiding sugary drinks, fast food, meals after 7 pm and to consider 30 min walks after dinner.   I spent 30 minutes with greater than 50% in face to face consultation.  All questions answered  Vikki Ports, MD.

## 2021-02-22 ENCOUNTER — Other Ambulatory Visit: Payer: Managed Care, Other (non HMO)

## 2021-06-28 ENCOUNTER — Encounter
Admission: RE | Admit: 2021-06-28 | Discharge: 2021-06-28 | Disposition: A | Discharge status: Home / Self Care | Payer: Managed Care, Other (non HMO) | Source: Ambulatory Visit | Attending: Obstetrics and Gynecology | Admitting: Obstetrics and Gynecology

## 2021-06-28 ENCOUNTER — Other Ambulatory Visit: Payer: Self-pay

## 2021-06-28 HISTORY — DX: Other specified postprocedural states: Z98.890

## 2021-06-28 HISTORY — DX: Personal history of urinary calculi: Z87.442

## 2021-06-28 HISTORY — DX: Nausea with vomiting, unspecified: R11.2

## 2021-06-28 NOTE — Patient Instructions (Addendum)
Your procedure is scheduled on: 07/08/21 Report to [facility], enter thru the Emergency Department. Arrival is 5:45 am [phone]  Remember: Instructions that are not followed completely may result in serious medical risk, up to and including death, or upon the discretion of your surgeon and anesthesiologist your surgery may need to be rescheduled.     _X__ 1. Do not eat food after midnight the night before your procedure.                 No gum chewing or hard candies. You may drink clear liquids up to 2 hours                 before you are scheduled to arrive for your surgery- DO not drink clear                 liquids within 2 hours of the start of your surgery.                .Diabetics water only  __X__2.  On the morning of surgery brush your teeth with toothpaste and water, you                 may rinse your mouth with mouthwash if you wish.  Do not swallow any              toothpaste of mouthwash.     _X__ 3.  No Alcohol for 24 hours before or after surgery.   _X__ 4.  Do Not Smoke or use e-cigarettes For 24 Hours Prior to Your Surgery.                 Do not use any chewable tobacco products for at least 6 hours prior to                 surgery.  ____  5.  Bring all medications with you on the day of surgery if instructed.   __X__  6.  Notify your doctor if there is any change in your medical condition      (cold, fever, infections).     Do not wear jewelry, make-up, hairpins, clips or nail polish. Do not wear lotions, powders, or perfumes.  Do not shave body hair 48 hours prior to surgery. Men may shave face and neck. Do not bring valuables to the hospital.    [facility] is not responsible for any belongings or valuables.  Contacts, dentures/partials or body piercings may not be worn into surgery. Bring a case for your contacts, glasses or hearing aids, a denture cup will be supplied. Leave your suitcase in the car. After surgery it may be brought to your room. For  patients admitted to the hospital, discharge time is determined by your treatment team.   Patients discharged the day of surgery will not be allowed to drive home.   Please read over the following fact sheets that you were given:   CHG soap  __X__ Take these medicines the morning of surgery with A SIP OF WATER:    1. none  2.   3.   4.  5.  6.  ____ Fleet Enema (as directed)   __X__ Use CHG Soap/SAGE wipes as directed  ____ Use inhalers on the day of surgery  ____ Stop metformin/Janumet/Farxiga 2 days prior to surgery    __X__ Take 1/2 of usual insulin dose the night before surgery. No insulin the morning  of surgery.   ____ Stop Blood Thinners Coumadin/Plavix/Xarelto/Pleta/Pradaxa/Eliquis/Effient/Aspirin  on   Or contact your Surgeon, Cardiologist or Medical Doctor regarding  ability to stop your blood thinners  __X__ Stop Anti-inflammatories 7 days before surgery such as Advil, Ibuprofen, Motrin,  BC or Goodies Powder, Naprosyn, Naproxen, Aleve, Aspirin    ____ Stop all herbal supplements, fish oil or vitamin E until after surgery.    ____ Bring C-Pap to the hospital.

## 2021-06-29 NOTE — H&P (Signed)
[name] is a 30 y.o. female presenting for repeat LTCS    30 y.o. G2P1001 at [redacted]w[redacted]d Patient's last menstrual period was 10/15/2020 (exact date). consistent with  with ultrasound @ [redacted]w[redacted]d.Estimated Date of Delivery: 07/22/2021 Sex of baby and name:  "Ainsley " baby girl  FOB:    [name] Factors complicating this pregnancy  Type II DM affecting pregnancy  PCP name:  Glorious Peach, PA Prepregnancy Meds: Metformin and Glipizide  Education:  Lifestyles appointment for insulin teaching: 12/30/2020 Current Pregnancy regimen: 12/06/2020: Per TJS - NPH 20units AM/8 units PM; Regular 10 units AM/8 units PM 01/05/2021: Per [facility] - NPH 20units AM/12 units PM; Regular 14 units AM/12units PM 01/27/21: am : 28N+16 Reg  pm: 16N + 14 Reg  02/03/21: Per TJS - AM: 32N +18R PM: 18N + 14R  03/17/21: Per TJS - AM: 32N +16R PM: 20N + 14R 03/31/21: Per [facility]- AM: 32N +16R PM: 22N + 16R 05/17/21: Per TJS-AM: 34N + 18R       PM: 24N + 18R 05/30/21      Am : 38N + 22 R , PM: 28N + 22 R  Baseline labs:  HgbA1C: 6.7 on 11/16/20 05/19/21: 5.6 Creatinine: 0.6 TSH: 1.697 EKG:  maternal EKG-normal 01/11/21 Consults: Ophthalmology consult:  MFM consult 01/18/21, done  See note nothing special to add Antenatal Assessments: [x]  low dose ASA (start 12-16 wks; discontinue at 35-37 wks):  [x]  Fetal echocardiogram (18-22w) completed:  Normal Serial Growth Scans [x]  28 weeks   Date: 04/26/21     EFW: 1146 g      Percentile: 53%    AFI: 15.71 cm @ 56% [x]  32 weeks   Date:  05/30/21   EFW:  2185g   Percentile:  57%    AFI: 18.16 cm @67 % [x]  36 weeks   Date: 06/28/21     EFW:    3372g   Percentile:  75%     AFI: 15.30 cm @56 % Weekly NST/AFI at 32 weeks  Twice weekly testing at 36 weeks  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 on 01/03/2017  01/27/21  counseled by [redacted]w[redacted]d regarding TOLAC vs repeat LTCS - interested in TOLAC Pre-op completed with:___on:___ Delivery preference: LTCS (07/08/21-TJS) Risk for placenta accreta spectrum with anterior previa: First cesarean birth: 3% Family history of cardiac defect Sister had cardiac defect as child that required surgery  Elevated blood pressure  01/03/2021 - 145/80 Will recheck 3x weekly  Screening results and needs: NOB:  Medicaid Questionnaire: N/A Depression Score: 2 MBT: B+  Ab screen: Negative  Pap: 05/20/20 Neg HIV: Negative Hep B/RPR:Negative/Non reactive   G/C: Neg/Neg Rubella: immune  VZV: Immune Aneuploidy:  First trimester:  MaternitT21: neg, c/w XX Second trimester (AFP/tetra): 01/28/21-neg 28 weeks:  Depression Score:1 Blood consent: signed 04/27/21 Hgb:11.1   Platelets:308    Glucola:   Rhogam: NA 36 weeks:  GBS: neg   G/C: neg/neg  Hgb: 11.3  Platelets: 294    HIV/RPR:   NR/NR Last 01/05/2017:  12/03/2020: Single, viable IUP, S =[redacted]w[redacted]d; FHT 118bpm; yolk sac imaged; cervical length = 4.06cm; right copus luteum cyst = 1.9cm; left ovary  appears WNL  03/03/21: Normal anatomy seen, Single, viable IUP, S=[redacted]w[redacted]d, FHR=155bpm, Cervical length=4.61cm, B/L ovaries appear wnl, Placenta=posterior fundal, Position=breech 04/26/21=breech, post fundal plac, EFW-1146 g @53 %, AFI- 15.71 cm @ 56% 05/30/21-vertex, spine lt, EFW- 2185 g@ 57%, AFI- 18.16 cm @67 % 06/07/21: vertex, post fundal plac, AFI-15.80 cm @57 % 06/14/21:vertex, post fundal plac, AFI-15.21 cm @54 % 06/28/21:vertex, fundal post plac, AFI-15.30 cm @56 % Immunization:   Flu in season - given 06/29/21 AC Tdap at 27-36 weeks - given 04/27/21  Covid-19 - Covid + 08/2020 (prior to pregnancy) Contraception Plan: OCPS, probably Feeding Plan: breastfeeding       . OB History     Gravida  2   Para  1   Term   1   Preterm  0   AB  0   Living  1      SAB  0   IAB  0   Ectopic  0   Multiple  0   Live Births  1          Past Medical History:  Diagnosis Date   Diabetes mellitus without complication (HCC)    History of kidney stones    Kidney stone 2012   Menorrhagia    Polycystic ovarian disease    PONV (postoperative nausea and vomiting)    vomited x2 in OR during previous C-section   UTI (lower urinary tract infection)    Past Surgical History:  Procedure Laterality Date   CESAREAN SECTION N/A 01/03/2017   Procedure: CESAREAN SECTION;  Surgeon: 08/29/21, MD;  Location: [facility] ORS;  Service: Obstetrics;  Laterality: N/A;   NO PAST SURGERIES     WISDOM TOOTH EXTRACTION     Family History: family history includes Arthritis in her paternal grandmother; Diabetes in her mother and paternal grandmother; Diabetes type II in her paternal grandfather; Heart disease in her maternal grandmother; Hypertension in her paternal grandmother; Thyroid cancer in her maternal grandmother. Social History:  reports that she has never smoked. She has never used smokeless tobacco. She reports that she does not drink alcohol and does not use drugs.       Review of Systems Review of Systems: A full review of systems was performed and negative except as noted in the HPI.   Eyes: no vision change  Ears: left ear pain  Oropharynx: no sore throat  Pulmonary . No shortness of breath , no hemoptysis Cardiovascular: no chest pain , no irregular heart beat  Gastrointestinal:no blood in stool . No diarrhea, no constipation Uro gynecologic: no dysuria , no pelvic pain Neurologic : no seizure , no migraines    Musculoskeletal: no muscular weakness  History   Last menstrual period 10/15/2020, unknown if currently breastfeeding. Exam Physical Exam   Lungs CTA RRR CV RRR Adb gravid   Prenatal labs: ABO, Rh:  B+ Antibody:  neg  Rubella:  Imm / Rubella IMM RPR:   NR  HBsAg:   neg HIV:    neg  GBS:   negative   Assessment/Plan: Elective repeat LTCS on 07/08/21 38+0 week  for insulin dep Diabetes   Risks discussed with patient   2013 Remmy Riffe 06/29/2021, 3:35 PM

## 2021-07-07 ENCOUNTER — Encounter: Payer: Self-pay | Admitting: Urgent Care

## 2021-07-07 ENCOUNTER — Other Ambulatory Visit: Payer: Managed Care, Other (non HMO)

## 2021-07-07 ENCOUNTER — Other Ambulatory Visit
Admission: RE | Admit: 2021-07-07 | Discharge: 2021-07-07 | Disposition: A | Discharge status: Home / Self Care | Payer: Managed Care, Other (non HMO) | Source: Ambulatory Visit | Attending: Obstetrics and Gynecology | Admitting: Obstetrics and Gynecology

## 2021-07-07 ENCOUNTER — Other Ambulatory Visit: Payer: Self-pay

## 2021-07-07 DIAGNOSIS — Z20822 Contact with and (suspected) exposure to covid-19: Secondary | ICD-10-CM | POA: Insufficient documentation

## 2021-07-07 DIAGNOSIS — Z01812 Encounter for preprocedural laboratory examination: Secondary | ICD-10-CM | POA: Insufficient documentation

## 2021-07-07 LAB — TYPE AND SCREEN
ABO/RH(D): B POS
Antibody Screen: NEGATIVE
Extend sample reason: UNDETERMINED

## 2021-07-07 LAB — SARS CORONAVIRUS 2 (TAT 6-24 HRS): SARS Coronavirus 2: NEGATIVE

## 2021-07-08 ENCOUNTER — Inpatient Hospital Stay: Payer: Managed Care, Other (non HMO) | Admitting: Registered Nurse

## 2021-07-08 ENCOUNTER — Inpatient Hospital Stay
Admission: RE | Admit: 2021-07-08 | Discharge: 2021-07-09 | DRG: 788 | Disposition: A | Discharge status: Home / Self Care | Payer: Managed Care, Other (non HMO) | Attending: Obstetrics and Gynecology | Admitting: Obstetrics and Gynecology

## 2021-07-08 ENCOUNTER — Encounter: Payer: Self-pay | Admitting: Obstetrics and Gynecology

## 2021-07-08 ENCOUNTER — Encounter
Admission: RE | Disposition: A | Discharge status: Home / Self Care | Payer: Self-pay | Source: Home / Self Care | Attending: Obstetrics and Gynecology

## 2021-07-08 ENCOUNTER — Other Ambulatory Visit: Payer: Self-pay

## 2021-07-08 ENCOUNTER — Emergency Department: Admission: EM | Admit: 2021-07-08 | Payer: Managed Care, Other (non HMO) | Source: Home / Self Care

## 2021-07-08 DIAGNOSIS — O99214 Obesity complicating childbirth: Secondary | ICD-10-CM | POA: Diagnosis present

## 2021-07-08 DIAGNOSIS — Z794 Long term (current) use of insulin: Secondary | ICD-10-CM | POA: Diagnosis not present

## 2021-07-08 DIAGNOSIS — Z3A38 38 weeks gestation of pregnancy: Secondary | ICD-10-CM | POA: Diagnosis not present

## 2021-07-08 DIAGNOSIS — Z98891 History of uterine scar from previous surgery: Secondary | ICD-10-CM

## 2021-07-08 DIAGNOSIS — Z9889 Other specified postprocedural states: Secondary | ICD-10-CM

## 2021-07-08 DIAGNOSIS — O24424 Gestational diabetes mellitus in childbirth, insulin controlled: Secondary | ICD-10-CM | POA: Diagnosis present

## 2021-07-08 DIAGNOSIS — Z20822 Contact with and (suspected) exposure to covid-19: Secondary | ICD-10-CM | POA: Diagnosis present

## 2021-07-08 DIAGNOSIS — Z8616 Personal history of COVID-19: Secondary | ICD-10-CM | POA: Diagnosis not present

## 2021-07-08 DIAGNOSIS — O34211 Maternal care for low transverse scar from previous cesarean delivery: Secondary | ICD-10-CM | POA: Diagnosis present

## 2021-07-08 LAB — GLUCOSE, CAPILLARY
Glucose-Capillary: 92 mg/dL (ref 70–99)
Glucose-Capillary: 93 mg/dL (ref 70–99)
Glucose-Capillary: 77 mg/dL (ref 70–99)
Glucose-Capillary: 81 mg/dL (ref 70–99)
Glucose-Capillary: 103 mg/dL — ABNORMAL HIGH (ref 70–99)
Glucose-Capillary: 109 mg/dL — ABNORMAL HIGH (ref 70–99)

## 2021-07-08 LAB — BASIC METABOLIC PANEL
Anion gap: 5 (ref 5–15)
BUN: 7 mg/dL (ref 6–20)
CO2: 19 mmol/L — ABNORMAL LOW (ref 22–32)
Calcium: 8.2 mg/dL — ABNORMAL LOW (ref 8.9–10.3)
Chloride: 113 mmol/L — ABNORMAL HIGH (ref 98–111)
Creatinine, Ser: 0.4 mg/dL — ABNORMAL LOW (ref 0.44–1.00)
GFR, Estimated: >60 mL/min (ref >60)
Glucose, Bld: 113 mg/dL — ABNORMAL HIGH (ref 70–99)
Potassium: 3.7 mmol/L (ref 3.5–5.1)
Sodium: 137 mmol/L (ref 135–145)

## 2021-07-08 LAB — CBC
HCT: 34.5 % — ABNORMAL LOW (ref 36.0–46.0)
Hemoglobin: 11 g/dL — ABNORMAL LOW (ref 12.0–15.0)
MCH: 26.1 pg (ref 26.0–34.0)
MCHC: 31.9 g/dL (ref 30.0–36.0)
MCV: 81.8 fL (ref 80.0–100.0)
Platelets: 290 10*3/uL (ref 150–400)
RBC: 4.22 MIL/uL (ref 3.87–5.11)
RDW: 13.5 % (ref 11.5–15.5)
WBC: 8.9 10*3/uL (ref 4.0–10.5)
nRBC: 0 % (ref 0.0–0.2)

## 2021-07-08 SURGERY — Surgical Case
Anesthesia: Spinal

## 2021-07-08 MED ORDER — KETOROLAC TROMETHAMINE 30 MG/ML IJ SOLN
INTRAMUSCULAR | Status: AC
  Filled 2021-07-08: qty 1

## 2021-07-08 MED ORDER — MORPHINE SULFATE (PF) 0.5 MG/ML IJ SOLN
INTRAMUSCULAR | Status: AC
  Filled 2021-07-08: qty 10

## 2021-07-08 MED ORDER — NALBUPHINE HCL 10 MG/ML IJ SOLN: 5.0000 mg | INTRAMUSCULAR | Status: DC | PRN

## 2021-07-08 MED ORDER — SENNOSIDES-DOCUSATE SODIUM 8.6-50 MG PO TABS
2.0000 | ORAL_TABLET | Freq: Every day | ORAL | Status: DC
  Administered 2021-07-09: 2 via ORAL
  Filled 2021-07-08: qty 2

## 2021-07-08 MED ORDER — KETOROLAC TROMETHAMINE 30 MG/ML IJ SOLN: 30.0000 mg | Freq: Four times a day (QID) | INTRAMUSCULAR | Status: AC | PRN

## 2021-07-08 MED ORDER — KETOROLAC TROMETHAMINE 30 MG/ML IJ SOLN
30.0000 mg | Freq: Four times a day (QID) | INTRAMUSCULAR | Status: AC
  Administered 2021-07-08 – 2021-07-09 (×4): 30 mg via INTRAVENOUS
  Filled 2021-07-08 (×4): qty 1

## 2021-07-08 MED ORDER — OXYCODONE HCL 5 MG PO TABS: 5.0000 mg | ORAL_TABLET | ORAL | Status: DC | PRN

## 2021-07-08 MED ORDER — NALBUPHINE HCL 10 MG/ML IJ SOLN: 5.0000 mg | Freq: Once | INTRAMUSCULAR | Status: DC | PRN

## 2021-07-08 MED ORDER — IBUPROFEN 600 MG PO TABS: 600.0000 mg | ORAL_TABLET | Freq: Four times a day (QID) | ORAL | Status: DC

## 2021-07-08 MED ORDER — COCONUT OIL OIL
1.0000 "application " | TOPICAL_OIL | Status: DC | PRN
  Filled 2021-07-08: qty 120

## 2021-07-08 MED ORDER — BUPIVACAINE HCL (PF) 0.5 % IJ SOLN
INTRAMUSCULAR | Status: DC | PRN
  Administered 2021-07-08: 60 mL

## 2021-07-08 MED ORDER — GABAPENTIN 300 MG PO CAPS
300.0000 mg | ORAL_CAPSULE | Freq: Once | ORAL | Status: AC
  Administered 2021-07-08: 300 mg via ORAL
  Filled 2021-07-08: qty 1

## 2021-07-08 MED ORDER — ONDANSETRON HCL 4 MG/2ML IJ SOLN
INTRAMUSCULAR | Status: DC | PRN
  Administered 2021-07-08: 4 mg via INTRAVENOUS

## 2021-07-08 MED ORDER — SODIUM CHLORIDE 0.9 % IV SOLN
INTRAVENOUS | Status: DC | PRN
  Administered 2021-07-08: 50 ug/min via INTRAVENOUS

## 2021-07-08 MED ORDER — ZOLPIDEM TARTRATE 5 MG PO TABS: 5.0000 mg | ORAL_TABLET | Freq: Every evening | ORAL | Status: DC | PRN

## 2021-07-08 MED ORDER — PHENYLEPHRINE HCL (PRESSORS) 10 MG/ML IV SOLN
INTRAVENOUS | Status: DC | PRN
  Administered 2021-07-08: 100 ug via INTRAVENOUS

## 2021-07-08 MED ORDER — DIPHENHYDRAMINE HCL 25 MG PO CAPS: 25.0000 mg | ORAL_CAPSULE | Freq: Four times a day (QID) | ORAL | Status: DC | PRN

## 2021-07-08 MED ORDER — OXYTOCIN-SODIUM CHLORIDE 30-0.9 UT/500ML-% IV SOLN
INTRAVENOUS | Status: DC | PRN
  Administered 2021-07-08: 30 [IU] via INTRAVENOUS

## 2021-07-08 MED ORDER — BUPIVACAINE LIPOSOME 1.3 % IJ SUSP
INTRAMUSCULAR | Status: AC
  Filled 2021-07-08: qty 20

## 2021-07-08 MED ORDER — SOD CITRATE-CITRIC ACID 500-334 MG/5ML PO SOLN
ORAL | Status: AC
  Filled 2021-07-08: qty 15

## 2021-07-08 MED ORDER — NALOXONE HCL 4 MG/10ML IJ SOLN
1.0000 ug/kg/h | INTRAVENOUS | Status: DC | PRN
  Filled 2021-07-08: qty 5

## 2021-07-08 MED ORDER — OXYTOCIN-SODIUM CHLORIDE 30-0.9 UT/500ML-% IV SOLN: 2.5000 [IU]/h | INTRAVENOUS | Status: AC

## 2021-07-08 MED ORDER — BUPIVACAINE IN DEXTROSE 0.75-8.25 % IT SOLN
INTRATHECAL | Status: DC | PRN
  Administered 2021-07-08: 1.6 mL via INTRATHECAL

## 2021-07-08 MED ORDER — LACTATED RINGERS IV SOLN: Freq: Once | INTRAVENOUS | Status: AC

## 2021-07-08 MED ORDER — ENOXAPARIN SODIUM 60 MG/0.6ML IJ SOSY
0.5000 mg/kg | PREFILLED_SYRINGE | INTRAMUSCULAR | Status: DC
  Administered 2021-07-09: 55 mg via SUBCUTANEOUS
  Filled 2021-07-08: qty 0.55

## 2021-07-08 MED ORDER — MENTHOL 3 MG MT LOZG
1.0000 | LOZENGE | OROMUCOSAL | Status: DC | PRN
  Filled 2021-07-08: qty 9

## 2021-07-08 MED ORDER — OXYTOCIN-SODIUM CHLORIDE 30-0.9 UT/500ML-% IV SOLN
INTRAVENOUS | Status: AC
  Filled 2021-07-08: qty 500

## 2021-07-08 MED ORDER — PHENYLEPHRINE HCL (PRESSORS) 10 MG/ML IV SOLN
INTRAVENOUS | Status: AC
  Filled 2021-07-08: qty 1

## 2021-07-08 MED ORDER — BUPIVACAINE HCL (PF) 0.5 % IJ SOLN
INTRAMUSCULAR | Status: AC
  Filled 2021-07-08: qty 30

## 2021-07-08 MED ORDER — DIPHENHYDRAMINE HCL 25 MG PO CAPS: 25.0000 mg | ORAL_CAPSULE | ORAL | Status: DC | PRN

## 2021-07-08 MED ORDER — SOD CITRATE-CITRIC ACID 500-334 MG/5ML PO SOLN
30.0000 mL | ORAL | Status: AC
  Administered 2021-07-08: 30 mL via ORAL

## 2021-07-08 MED ORDER — NALOXONE HCL 0.4 MG/ML IJ SOLN: 0.4000 mg | INTRAMUSCULAR | Status: DC | PRN

## 2021-07-08 MED ORDER — ONDANSETRON HCL 4 MG/2ML IJ SOLN
INTRAMUSCULAR | Status: AC
  Filled 2021-07-08: qty 2

## 2021-07-08 MED ORDER — MORPHINE SULFATE (PF) 0.5 MG/ML IJ SOLN
INTRAMUSCULAR | Status: DC | PRN
  Administered 2021-07-08: .1 mg via INTRATHECAL

## 2021-07-08 MED ORDER — FENTANYL CITRATE (PF) 100 MCG/2ML IJ SOLN
INTRAMUSCULAR | Status: AC
  Filled 2021-07-08: qty 2

## 2021-07-08 MED ORDER — LACTATED RINGERS IV SOLN: INTRAVENOUS | Status: DC | PRN

## 2021-07-08 MED ORDER — LACTATED RINGERS IV SOLN
INTRAVENOUS | Status: DC
  Administered 2021-07-08: 500 mL via INTRAVENOUS

## 2021-07-08 MED ORDER — ACETAMINOPHEN 500 MG PO TABS
1000.0000 mg | ORAL_TABLET | Freq: Four times a day (QID) | ORAL | Status: DC
  Administered 2021-07-08 – 2021-07-09 (×4): 1000 mg via ORAL
  Filled 2021-07-08 (×4): qty 2

## 2021-07-08 MED ORDER — SODIUM CHLORIDE (PF) 0.9 % IJ SOLN
INTRAMUSCULAR | Status: DC | PRN
  Administered 2021-07-08: 20 mL via INTRAVENOUS

## 2021-07-08 MED ORDER — CHLORHEXIDINE GLUCONATE 0.12 % MT SOLN
15.0000 mL | Freq: Once | OROMUCOSAL | Status: AC
  Administered 2021-07-08: 15 mL via OROMUCOSAL
  Filled 2021-07-08: qty 15

## 2021-07-08 MED ORDER — OXYTOCIN-SODIUM CHLORIDE 30-0.9 UT/500ML-% IV SOLN
INTRAVENOUS | Status: AC
  Administered 2021-07-08: 2.5 [IU]/h via INTRAVENOUS
  Filled 2021-07-08: qty 500

## 2021-07-08 MED ORDER — 0.9 % SODIUM CHLORIDE (POUR BTL) OPTIME
TOPICAL | Status: DC | PRN
  Administered 2021-07-08: 500 mL

## 2021-07-08 MED ORDER — TETANUS-DIPHTH-ACELL PERTUSSIS 5-2.5-18.5 LF-MCG/0.5 IM SUSY: 0.5000 mL | PREFILLED_SYRINGE | Freq: Once | INTRAMUSCULAR | Status: DC

## 2021-07-08 MED ORDER — ONDANSETRON HCL 4 MG/2ML IJ SOLN: 4.0000 mg | Freq: Three times a day (TID) | INTRAMUSCULAR | Status: DC | PRN

## 2021-07-08 MED ORDER — METFORMIN HCL 500 MG PO TABS
1000.0000 mg | ORAL_TABLET | Freq: Two times a day (BID) | ORAL | Status: DC
  Administered 2021-07-08 – 2021-07-09 (×2): 1000 mg via ORAL
  Filled 2021-07-08 (×3): qty 2

## 2021-07-08 MED ORDER — GLIPIZIDE 5 MG PO TABS
2.5000 mg | ORAL_TABLET | Freq: Every day | ORAL | Status: DC
  Administered 2021-07-08: 2.5 mg via ORAL
  Filled 2021-07-08 (×2): qty 0.5

## 2021-07-08 MED ORDER — CEFAZOLIN SODIUM-DEXTROSE 2-4 GM/100ML-% IV SOLN
2.0000 g | INTRAVENOUS | Status: AC
  Administered 2021-07-08: 2 g via INTRAVENOUS
  Filled 2021-07-08: qty 100

## 2021-07-08 MED ORDER — OXYCODONE HCL 5 MG PO TABS
5.0000 mg | ORAL_TABLET | ORAL | Status: DC | PRN
Start: 2021-07-08 — End: 2021-07-09

## 2021-07-08 MED ORDER — SIMETHICONE 80 MG PO CHEW
80.0000 mg | CHEWABLE_TABLET | Freq: Three times a day (TID) | ORAL | Status: DC
  Administered 2021-07-08 – 2021-07-09 (×4): 80 mg via ORAL
  Filled 2021-07-08 (×4): qty 1

## 2021-07-08 MED ORDER — ACETAMINOPHEN 500 MG PO TABS
1000.0000 mg | ORAL_TABLET | Freq: Once | ORAL | Status: AC
  Administered 2021-07-08: 1000 mg via ORAL
  Filled 2021-07-08: qty 2

## 2021-07-08 MED ORDER — SODIUM CHLORIDE 0.9% FLUSH: 3.0000 mL | INTRAVENOUS | Status: DC | PRN

## 2021-07-08 MED ORDER — WITCH HAZEL-GLYCERIN EX PADS: 1.0000 "application " | MEDICATED_PAD | CUTANEOUS | Status: DC | PRN

## 2021-07-08 MED ORDER — SODIUM CHLORIDE (PF) 0.9 % IJ SOLN
INTRAMUSCULAR | Status: AC
  Filled 2021-07-08: qty 50

## 2021-07-08 MED ORDER — DIPHENHYDRAMINE HCL 50 MG/ML IJ SOLN: 12.5000 mg | INTRAMUSCULAR | Status: DC | PRN

## 2021-07-08 MED ORDER — SIMETHICONE 80 MG PO CHEW
80.0000 mg | CHEWABLE_TABLET | ORAL | Status: DC | PRN
  Administered 2021-07-09: 80 mg via ORAL
  Filled 2021-07-08: qty 1

## 2021-07-08 MED ORDER — PRENATAL MULTIVITAMIN CH
1.0000 | ORAL_TABLET | Freq: Every day | ORAL | Status: DC
  Administered 2021-07-08 – 2021-07-09 (×2): 1 via ORAL
  Filled 2021-07-08 (×2): qty 1

## 2021-07-08 MED ORDER — FENTANYL CITRATE (PF) 100 MCG/2ML IJ SOLN
INTRAMUSCULAR | Status: DC | PRN
  Administered 2021-07-08: 15 ug via INTRATHECAL

## 2021-07-08 MED ORDER — ORAL CARE MOUTH RINSE: 15.0000 mL | Freq: Once | OROMUCOSAL | Status: AC

## 2021-07-08 MED ORDER — DIBUCAINE (PERIANAL) 1 % EX OINT: 1.0000 "application " | TOPICAL_OINTMENT | CUTANEOUS | Status: DC | PRN

## 2021-07-08 SURGICAL SUPPLY — 30 items
BARRIER ADHS 3X4 INTERCEED (GAUZE/BANDAGES/DRESSINGS) ×2 IMPLANT
CHLORAPREP W/TINT 26 (MISCELLANEOUS) ×2 IMPLANT
DRESSING TELFA 8X3 (GAUZE/BANDAGES/DRESSINGS) ×2 IMPLANT
DRSG TELFA 3X8 NADH (GAUZE/BANDAGES/DRESSINGS) ×2 IMPLANT
ELECT CAUTERY BLADE 6.4 (BLADE) ×2 IMPLANT
ELECT REM PT RETURN 9FT ADLT (ELECTROSURGICAL) ×2
ELECTRODE REM PT RTRN 9FT ADLT (ELECTROSURGICAL) ×1 IMPLANT
GAUZE CURAFIL 4X4 (GAUZE/BANDAGES/DRESSINGS) IMPLANT
GAUZE SPONGE 4X4 12PLY STRL (GAUZE/BANDAGES/DRESSINGS) ×2 IMPLANT
GLOVE SURG SYN 8.0 (GLOVE) ×2 IMPLANT
GOWN STRL REUS W/ TWL LRG LVL3 (GOWN DISPOSABLE) ×2 IMPLANT
GOWN STRL REUS W/ TWL XL LVL3 (GOWN DISPOSABLE) ×1 IMPLANT
GOWN STRL REUS W/TWL LRG LVL3 (GOWN DISPOSABLE) ×2
GOWN STRL REUS W/TWL XL LVL3 (GOWN DISPOSABLE) ×1
MANIFOLD NEPTUNE II (INSTRUMENTS) ×2 IMPLANT
MAT PREVALON FULL STRYKER (MISCELLANEOUS) ×2 IMPLANT
NEEDLE HYPO 22GX1.5 SAFETY (NEEDLE) ×2 IMPLANT
NS IRRIG 1000ML POUR BTL (IV SOLUTION) ×2 IMPLANT
PACK C SECTION AR (MISCELLANEOUS) ×2 IMPLANT
PAD OB MATERNITY 4.3X12.25 (PERSONAL CARE ITEMS) ×2 IMPLANT
PAD PREP 24X41 OB/GYN DISP (PERSONAL CARE ITEMS) ×2 IMPLANT
SCRUB EXIDINE 4% CHG 4OZ (MISCELLANEOUS) ×2 IMPLANT
STRAP SAFETY 5IN WIDE (MISCELLANEOUS) ×2 IMPLANT
SUCT VACUUM KIWI BELL (SUCTIONS) ×2 IMPLANT
SUT CHROMIC 1 CTX 36 (SUTURE) ×6 IMPLANT
SUT CHROMIC 2 0 CT 1 (SUTURE) ×2 IMPLANT
SUT PLAIN GUT 0 (SUTURE) ×4 IMPLANT
SUT VIC AB 0 CT1 36 (SUTURE) ×4 IMPLANT
SYR 30ML LL (SYRINGE) ×4 IMPLANT
WATER STERILE IRR 500ML POUR (IV SOLUTION) ×2 IMPLANT

## 2021-07-08 NOTE — Op Note (Signed)
[name], FAUGHT MEDICAL RECORD NO: [id number] ACCOUNT NO: [id number] DATE OF BIRTH: 1991-07-23 FACILITY: [facility] LOCATION: [facility] PHYSICIAN: Suzy Bouchard, MD  Operative Report   DATE OF PROCEDURE: 07/08/2021  PREOPERATIVE DIAGNOSES:  1.  38 plus 0 weeks' estimated gestational age. 2.  Insulin-dependent gestational diabetic. 3.  Previous cesarean section, elects for repeat cesarean section.  POSTOPERATIVE DIAGNOSES: 1.  38 plus 0 weeks' estimated gestational age. 2.  Insulin-dependent gestational diabetic. 3.  Previous cesarean section, elects for repeat cesarean section. 4.  Vigorous female, delivered.  PROCEDURE:  Repeat low transverse cesarean section.  SURGEON:  Suzy Bouchard, MD  ASSISTANT:  Margaretmary Eddy, certified nurse midwife.  ANESTHESIA:  Spinal.  INDICATIONS:  This is a 30 year old, gravida 2, para 1, patient with insulin-dependent gestational diabetes and a previous cesarean section, and she elects for repeat cesarean section.  DESCRIPTION OF PROCEDURE:  After adequate spinal anesthesia, the patient was placed in dorsal supine position with hip roll on the right side.  The patient's abdomen was prepped and draped in sterile fashion.  The patient received 2 g of IV Ancef for  surgical prophylaxis.  Timeout was performed.  A Pfannenstiel incision was made 2 fingerbreadths above the symphysis pubis.  Sharp dissection was used to identify the fascia.  The fascia was opened in the midline, opened in transverse fashion.  Superior  aspect of the fascia was grasped with Kocher clamps, and the recti muscles were dissected free.  The inferior aspect of the fascia was grasped with Kocher clamps.  Pyramidalis muscles were dissected free.  Entry into the peritoneal cavity was  accomplished sharply.  There were some omental adhesions that were taken down sharply.  A low transverse uterine incision was made.  Upon entry into the endometrial cavity, clear fluid  resulted.  Fetal head was brought to the incision after extending the  incision with transverse traction.  Kiwi vacuum was applied to the fetal occiput, and with 1 pull, the head was delivered, and the vacuum was removed.  Shoulders and body were delivered without difficulty.  A large vigorous female who was then dried on  the mother's abdomen for 60 seconds for delayed cord clamping.  Apgars were then assigned by the nursery staff 9 and 9.  Placenta was manually delivered. The uterus was exteriorized.  The endometrial cavity was wiped clean with a laparotomy tape.   Uterine incision was closed with 1 chromic suture in a running locking fashion, good approximation of the edges.  Good hemostasis was noted.  Fallopian tubes and ovaries appeared normal.  Posterior cul-de-sac was irrigated and suctioned for hemostasis.   Uterus was placed back into the abdominal cavity.  The pericolic gutters were wiped clean with laparotomy tape.  Uterine incision again appeared hemostatic.  Interceed was placed over the uterine incision in T shape fashion.  Fascia was then closed with  0 Vicryl suture in a running nonlocking fashion, 2 separate sutures were used.  Fascial edges were then injected with a solution of 60 mL of 0.5% Marcaine and 20 mL normal saline.  40 mL of this solution was injected intrafascial.  Subcutaneous tissues  were irrigated and bovied for hemostasis, and given the depth of the subcutaneous tissues of 3 cm, the subcutaneous tissue was closed with a 2-0 chromic suture.  Skin was reapproximated with Insorb absorbable staples with good cosmetic effect.  Another  30 mL of Marcaine solution was used to inject beneath the skin.  There were no complications.  QUANTITATIVE BLOOD LOSS:  511 mL.  URINE OUTPUT:  20 mL.  INTRAOPERATIVE FLUIDS:  1600 mL.  The patient did receive 30 mg Toradol intravenously at the end of the case, and she was taken to recovery room in good condition.   [facility] D: 07/08/2021  9:38:34 am T: 07/08/2021 1:57:00 pm  JOB: [id number]/ [id number]

## 2021-07-08 NOTE — Lactation Note (Addendum)
This note was copied from a baby's chart. Lactation Consultation Note  Patient Name: Girl Pallie Swigert Today's Date: 07/08/2021 Reason for consult: L&D Initial assessment Age:30 hours  Maternal Data Has patient been taught Hand Expression?: Yes  Feeding Mother's Current Feeding Choice: Breast Milk First attempt, baby unable to latch despite occ rooting, in cradle hold on left, football on right, colostrum expressed, due to nipple flattening when compressed and baby sl with decreased tone, baby grunty when attempting, skin to skin on chest, 20 mm nipple shield applied after hand expression of colostrum, baby latched after few attempts, in cradle hold on right, has difficulty coordinating suck, tires quickly, after approx. 5 min, transferred to left breast in football hold with nipple shield in place, latched after few attempts and nursed off an on for approx. 10 min, needs frequent rest breaks, after this baby, sl grunty and sleepy, feeding ended to preserve energy and baby placed on mom's chest, skin to skin  LATCH Score Latch: Repeated attempts needed to sustain latch, nipple held in mouth throughout feeding, stimulation needed to elicit sucking reflex.  Audible Swallowing: A few with stimulation  Type of Nipple: Flat  Comfort (Breast/Nipple): Soft / non-tender  Hold (Positioning): Assistance needed to correctly position infant at breast and maintain latch.  LATCH Score: 6   Lactation Tools Discussed/Used Tools: Nipple Shields Nipple shield size: 20  Interventions Interventions: Assisted with latch;Skin to skin;Hand express  Discharge Pump: Personal WIC Program: No  Consult Status Consult Status: Follow-up from L&D    Dyann Kief 07/08/2021, 10:27 AM

## 2021-07-08 NOTE — Progress Notes (Signed)
   07/08/21 0615  Fetal Heart Rate A  Mode External  Baseline Rate (A) 145 bpm  Variability 6-25 BPM  Accelerations 15 x 15  Decelerations None  Uterine Activity  Mode Toco  Contraction Frequency (min) UI   NST is category 1, 15x15 accels, no notable decels, moderate variability.

## 2021-07-08 NOTE — Progress Notes (Signed)
Ready for surgery All questions answered . Proceed

## 2021-07-08 NOTE — Anesthesia Procedure Notes (Signed)
Spinal  Patient location during procedure: OR Start time: 07/08/2021 7:40 AM End time: 07/08/2021 7:45 AM Reason for block: surgical anesthesia Staffing Performed: anesthesiologist  Anesthesiologist: Martha Clan, MD Resident/CRNA: Hedda Slade, CRNA Preanesthetic Checklist Completed: patient identified, IV checked, site marked, risks and benefits discussed, surgical consent, monitors and equipment checked, pre-op evaluation and timeout performed Spinal Block Patient position: sitting Prep: ChloraPrep Patient monitoring: heart rate, continuous pulse ox, blood pressure and cardiac monitor Approach: midline Location: L3-4 Injection technique: single-shot Needle Needle type: Whitacre and Introducer  Needle gauge: 24 G Needle length: 9 cm Assessment Sensory level: T4 Events: CSF return Additional Notes Negative paresthesia. Negative blood return. Positive free-flowing CSF. Expiration date of kit checked and confirmed. Patient tolerated procedure well, without complications.

## 2021-07-08 NOTE — Discharge Instructions (Signed)
Discharge Instructions:  ° °Follow-up Appointment:  ° °If there are any new medications, they have been ordered and will be available for pickup at the listed pharmacy on your way home from the hospital.  ° °Call office if you have any of the following: headache, visual changes, fever >101.0 F, chills, shortness of breath, breast concerns, excessive vaginal bleeding, incision drainage or problems, leg pain or redness, depression or any other concerns. If you have vaginal discharge with an odor, let your doctor know.  ° °It is normal to bleed for up to 6 weeks. You should not soak through more than 1 pad in 1 hour. If you have a blood clot larger than your fist with continued bleeding, call your doctor.  ° °After a c-section, you should expect a small amount of blood or clear fluid coming from the incision and abdominal cramping/soreness. Inspect your incision site daily. Stand in front of a mirror to look for any redness, incision opening, or discolored/odorness drainage. Take a shower daily and continue good hygiene. Use own towel and washcloth (do not share). Make sure your sheets on your bed are clean. No pets sleeping around your incision site. Dressing will be removed at your postpartum visit. If the dressing does become wet or soiled underneath, it is okay to remove it.  ° °Activity: Do not lift > 10 lbs for 6 weeks (do not lift anything heavier than your baby). °No intercourse, tampons, swimming pools, hot tubs, baths (only showers) for 6 weeks.  °No driving for 1-2 weeks. °Continue prenatal vitamin, especially if breastfeeding. °Increase calories and fluids (water) while breastfeeding.  ° °Your milk will come in, in the next couple of days (right now it is colostrum). You may have a slight fever when your milk comes in, but it should go away on its own.  If it does not, and rises above 101 F please call the doctor. You will also feel achy and your breasts will be firm. They will also start to leak. If you  are breastfeeding, continue as you have been and you can pump/express milk for comfort.  ° °If you have too much milk, your breasts can become engorged, which could lead to mastitis. This is an infection of the milk ducts. It can be very painful and you will need to notify your doctor to obtain a prescription for antibiotics. You can also treat it with a shower or hot/cold compress.  ° °For concerns about your baby, please call your pediatrician.  °For breastfeeding concerns, the lactation consultant can be reached at [phone].  ° °Postpartum blues (feelings of happy one minute and sad another minute) are normal for the first few weeks but if it gets worse let your doctor know.  ° °Congratulations! We enjoyed caring for you and your new bundle of joy!  °

## 2021-07-08 NOTE — Transfer of Care (Signed)
Immediate Anesthesia Transfer of Care Note  Patient: [name]  Procedure(s) Performed: REPEAT CESAREAN SECTION  Patient Location: LDR5  Anesthesia Type:Spinal  Level of Consciousness: awake, alert  and oriented  Airway & Oxygen Therapy: Patient Spontanous Breathing  Post-op Assessment: Report given to RN and Post -op Vital signs reviewed and stable  Post vital signs: Reviewed and stable  Last Vitals:  Vitals Value Taken Time  BP 117/61 07/08/21 0859  Temp    Pulse 77 07/08/21 0859  Resp 21 07/08/21 0859  SpO2 2 % 07/08/21 0859    Last Pain:  Vitals:   07/08/21 0859  TempSrc:   PainSc: 0-No pain         Complications: No notable events documented.

## 2021-07-08 NOTE — Brief Op Note (Signed)
07/08/2021  8:51 AM  PATIENT:  [name]  30 y.o. female  PRE-OPERATIVE DIAGNOSIS:  elective repeat cesarean , A2DM on insulin  POST-OPERATIVE DIAGNOSIS:  elective, A2DM on insulin  PROCEDURE:  Procedure(s): REPEAT CESAREAN SECTION (N/A)LTCS  SURGEON:  Surgeon(s) and Role:    * Arif Amendola, Ihor Austin, MD - Primary  PHYSICIAN ASSISTANT: MAckie , CNM   ASSISTANTS: none   ANESTHESIA:   spinal  EBL:  QBL  511 mL  IOF 1600 cc  BLOOD ADMINISTERED:none  DRAINS: Urinary Catheter (Foley)   LOCAL MEDICATIONS USED:  MARCAINE     SPECIMEN:  No Specimen  DISPOSITION OF SPECIMEN:  N/A  COUNTS:  YES  TOURNIQUET:  * No tourniquets in log *  DICTATION: .Other Dictation: Dictation Number verbal  PLAN OF CARE: Admit to inpatient   PATIENT DISPOSITION:  PACU - hemodynamically stable.   Delay start of Pharmacological VTE agent (>24hrs) due to surgical blood loss or risk of bleeding: not applicable

## 2021-07-08 NOTE — Lactation Note (Signed)
This note was copied from a baby's chart. Lactation Consultation Note  Patient Name: [name] NOIBB'C Date: 07/08/2021 Reason for consult: Follow-up assessment;Early term 37-38.6wks Age:30 hours  Maternal Data    Feeding Mother's Current Feeding Choice: Breast Milk and Formula Nipple Type: Slow - flow, baby continues to have periods of grunting, and blueness of extremities depending on positioning, will latch with nipple shield on left breast in football hold, but will suck a few times, stop and will either cry if stimulated or come off breast,  on right breast in cradle hold had more sustained nursing, but still for short periods   LATCH Score Latch: Repeated attempts needed to sustain latch, nipple held in mouth throughout feeding, stimulation needed to elicit sucking reflex.  Audible Swallowing: None  Type of Nipple: Flat  Comfort (Breast/Nipple): Soft / non-tender  Hold (Positioning): Assistance needed to correctly position infant at breast and maintain latch.  LATCH Score: 5   Lactation Tools Discussed/Used Tools: Nipple Shields Nipple shield size: 20  Interventions Interventions: Assisted with latch;Skin to skin;Hand express;Breast compression;Adjust position;Support pillows;Education  Discharge    Consult Status Consult Status: Follow-up Date: 07/08/21 Follow-up type: In-patient    Dyann Kief 07/08/2021, 2:40 PM

## 2021-07-08 NOTE — Anesthesia Preprocedure Evaluation (Signed)
Anesthesia Evaluation  Patient identified by MRN, date of birth, ID band Patient awake    Reviewed: Allergy & Precautions, NPO status , Patient's Chart, lab work & pertinent test results  History of Anesthesia Complications (+) PONVNegative for: history of anesthetic complications  Airway Mallampati: III  TM Distance: >3 FB Neck ROM: Full    Dental no notable dental hx. (+) Dental Advidsory Given   Pulmonary neg pulmonary ROS, neg shortness of breath, neg sleep apnea, neg COPD, neg recent URI,    breath sounds clear to auscultation- rhonchi (-) wheezing      Cardiovascular Exercise Tolerance: Good (-) hypertension(-) angina(-) CAD and (-) Past MI (-) dysrhythmias  Rhythm:Regular Rate:Normal - Systolic murmurs and - Diastolic murmurs    Neuro/Psych negative neurological ROS  negative psych ROS   GI/Hepatic negative GI ROS, Neg liver ROS,   Endo/Other  diabetesMorbid obesity  Renal/GU Renal disease (hx of nephrolithiasis)     Musculoskeletal negative musculoskeletal ROS (+)   Abdominal (+) + obese, Gravid abdomen  Peds  Hematology negative hematology ROS (+)   Anesthesia Other Findings Past Medical History: No date: Diabetes mellitus without complication (HCC) No date: History of kidney stones 2012: Kidney stone No date: Menorrhagia No date: Polycystic ovarian disease No date: PONV (postoperative nausea and vomiting)     Comment:  vomited x2 in OR during previous C-section No date: UTI (lower urinary tract infection)   Reproductive/Obstetrics (+) Pregnancy                             Anesthesia Physical  Anesthesia Plan  ASA: 3  Anesthesia Plan: Spinal   Post-op Pain Management:    Induction:   PONV Risk Score and Plan:   Airway Management Planned:   Additional Equipment:   Intra-op Plan:   Post-operative Plan:   Informed Consent: I have reviewed the patients History  and Physical, chart, labs and discussed the procedure including the risks, benefits and alternatives for the proposed anesthesia with the patient or authorized representative who has indicated his/her understanding and acceptance.       Plan Discussed with: Anesthesiologist  Anesthesia Plan Comments:         Lab Results  Component Value Date   WBC 8.9 07/08/2021   HGB 11.0 (L) 07/08/2021   HCT 34.5 (L) 07/08/2021   MCV 81.8 07/08/2021   PLT 290 07/08/2021    Anesthesia Quick Evaluation

## 2021-07-08 NOTE — Discharge Summary (Signed)
Obstetrical Discharge Summary  Patient Name: [name] DOB: 1991/01/25 MRN: [id number]  Date of Admission: 07/08/2021 Date of Delivery: 07/08/21 Delivered by: Beverly Gust MD Date of Discharge: 07/09/2021  Primary OB: [facility]  [id number] last menstrual period was 10/15/2020 (approximate). EDC Estimated Date of Delivery: 07/22/21 Gestational Age at Delivery: [redacted]w[redacted]d   Antepartum complications: A2 GDM  insulin  Admitting Diagnosis: Scheduled Repeat C/s Secondary Diagnosis: Patient Active Problem List   Diagnosis Date Noted   Previous cesarean section 07/08/2021   Postoperative state 07/08/2021   Status post cesarean delivery 01/03/2017   Right flank pain 09/27/2016   Amenorrhea 01/02/2014   Diabetes mellitus, new onset (HCC) 12/15/2013   Screening for ischemic heart disease 12/15/2013    Augmentation: N/A Complications: None Intrapartum complications/course: see op note Date of Delivery: 07/08/21 Delivered By: Beverly Gust MD Delivery Type: repeat cesarean section, low transverse incision Anesthesia: epidural Placenta: spontaneous Laceration:  Episiotomy: none Newborn Data: Live born female  Birth Weight:   APGAR: 9, 9  Newborn Delivery   Birth date/time: 07/08/2021 08:10:00 Delivery type: C-Section, Vacuum Assisted Trial of labor: No C-section categorization: Repeat        Postpartum Procedures: none  Edinburgh:  Edinburgh Postnatal Depression Scale Screening Tool 07/08/2021  I have been able to laugh and see the funny side of things. 0  I have looked forward with enjoyment to things. 0  I have blamed myself unnecessarily when things went wrong. 0  I have been anxious or worried for no good reason. 0  I have felt scared or panicky for no good reason. 0  Things have been getting on top of me. 0  I have been so unhappy that I have had difficulty sleeping. 0  I have felt sad or miserable. 0  I have been so unhappy that I have been crying.  0  The thought of harming myself has occurred to me. 0  Edinburgh Postnatal Depression Scale Total 0    P  Patient had an uncomplicated postpartum course.  By time of discharge on POD#1, her pain was controlled on oral pain medications; she had appropriate lochia and was ambulating, voiding without difficulty, tolerating regular diet and passing flatus.   She was deemed stable for discharge to home.    Discharge Physical Exam:  BP 116/64 (BP Location: Right Arm)   Pulse 87   Temp 98.5 F (36.9 C) (Oral)   Resp 20   Ht 5\' 4"  (1.626 m)   Wt 108.9 kg   LMP 10/15/2020 (Approximate)   SpO2 97%   Breastfeeding Yes   BMI 41.20 kg/m   General: NAD CV: RRR Pulm: CTABL, nl effort ABD: s/nd/nt, fundus firm and below the umbilicus Lochia: moderate Incision: c/d/i DVT Evaluation: LE non-ttp, no evidence of DVT on exam.  Hemoglobin  Date Value Ref Range Status  07/09/2021 9.9 (L) 12.0 - 15.0 g/dL Final  07/11/2021 99/37/1696 g/dL Final   HCT  Date Value Ref Range Status  07/09/2021 30.4 (L) 36.0 - 46.0 % Final  10/20/2016 34 % Final     Disposition: stable, discharge to home. Baby Feeding: breastmilk and formula Baby Disposition: home with mom  Rh Immune globulin given: n/a Rubella vaccine given: Immunie Tdap vaccine given in AP or PP setting: 04/27/21 Flu vaccine given in AP or PP setting: 06/29/21  Contraception: Pills  Prenatal Labs:   ABO, Rh:  B+ Antibody:  neg  Rubella:  Imm / Rubella IMM RPR:   NR  HBsAg:  neg HIV:   neg  GBS:   negative    Plan:  [name] was discharged to home in good condition. Follow-up appointment with delivering provider in 6 weeks.  Discharge Medications: Allergies as of 07/09/2021   No Known Allergies      Medication List     STOP taking these medications    aspirin EC 81 MG tablet   folic acid 1 MG tablet Commonly known as: FOLVITE   insulin NPH Human 100 UNIT/ML injection Commonly known as: NOVOLIN N   insulin  regular 100 units/mL injection Commonly known as: NOVOLIN R       TAKE these medications    acetaminophen 500 MG tablet Commonly known as: TYLENOL Take 2 tablets (1,000 mg total) by mouth every 6 (six) hours. What changed:  how much to take when to take this reasons to take this   calcium carbonate 500 MG chewable tablet Commonly known as: TUMS - dosed in mg elemental calcium Chew 1 tablet by mouth daily as needed for indigestion or heartburn.   cetirizine 10 MG tablet Commonly known as: ZYRTEC Take 10 mg by mouth daily.   fluticasone 50 MCG/ACT nasal spray Commonly known as: FLONASE Place 2 sprays into the nose daily as needed.   glipiZIDE 5 MG tablet Commonly known as: GLUCOTROL Take 0.5 tablets (2.5 mg total) by mouth at bedtime.   ibuprofen 600 MG tablet Commonly known as: ADVIL Take 1 tablet (600 mg total) by mouth every 6 (six) hours.   metFORMIN 1000 MG tablet Commonly known as: GLUCOPHAGE Take 1 tablet (1,000 mg total) by mouth 2 (two) times daily with a meal.   multivitamin-prenatal 27-0.8 MG Tabs tablet Take 1 tablet by mouth daily.   oxyCODONE 5 MG immediate release tablet Commonly known as: Oxy IR/ROXICODONE Take 1 tablet (5 mg total) by mouth every 4 (four) hours as needed for up to 7 days for severe pain or breakthrough pain.   senna-docusate 8.6-50 MG tablet Commonly known as: Senokot-S Take 2 tablets by mouth daily. Start taking on: July 10, 2021   simethicone 80 MG chewable tablet Commonly known as: MYLICON Chew 1 tablet (80 mg total) by mouth 3 (three) times daily after meals.         Follow-up Information     Schermerhorn, Ihor Austin, MD Follow up in 2 week(s).   Specialty: Obstetrics and Gynecology Why: post op appt Contact information: [address] [place] [address] [phone]                 Signed: Cyril Mourning 07/09/2021 11:44 AM

## 2021-07-09 ENCOUNTER — Encounter: Payer: Self-pay | Admitting: Obstetrics and Gynecology

## 2021-07-09 LAB — CBC
HCT: 30.4 % — ABNORMAL LOW (ref 36.0–46.0)
Hemoglobin: 9.9 g/dL — ABNORMAL LOW (ref 12.0–15.0)
MCH: 26.8 pg (ref 26.0–34.0)
MCHC: 32.6 g/dL (ref 30.0–36.0)
MCV: 82.2 fL (ref 80.0–100.0)
Platelets: 233 10*3/uL (ref 150–400)
RBC: 3.7 MIL/uL — ABNORMAL LOW (ref 3.87–5.11)
RDW: 13.5 % (ref 11.5–15.5)
WBC: 8.8 10*3/uL (ref 4.0–10.5)
nRBC: 0 % (ref 0.0–0.2)

## 2021-07-09 LAB — GLUCOSE, CAPILLARY
Glucose-Capillary: 109 mg/dL — ABNORMAL HIGH (ref 70–99)
Glucose-Capillary: 95 mg/dL (ref 70–99)
Glucose-Capillary: 81 mg/dL (ref 70–99)

## 2021-07-09 MED ORDER — ACETAMINOPHEN 500 MG PO TABS: 1000.0000 mg | ORAL_TABLET | Freq: Four times a day (QID) | ORAL | 0 refills | Status: DC

## 2021-07-09 MED ORDER — SIMETHICONE 80 MG PO CHEW: 80.0000 mg | CHEWABLE_TABLET | Freq: Three times a day (TID) | ORAL | 0 refills | Status: DC

## 2021-07-09 MED ORDER — OXYCODONE HCL 5 MG PO TABS: 5.0000 mg | ORAL_TABLET | ORAL | 0 refills | Status: AC | PRN

## 2021-07-09 MED ORDER — METFORMIN HCL 1000 MG PO TABS: 1000.0000 mg | ORAL_TABLET | Freq: Two times a day (BID) | ORAL | 11 refills | Status: DC

## 2021-07-09 MED ORDER — IBUPROFEN 600 MG PO TABS: 600.0000 mg | ORAL_TABLET | Freq: Four times a day (QID) | ORAL | Status: DC

## 2021-07-09 MED ORDER — SENNOSIDES-DOCUSATE SODIUM 8.6-50 MG PO TABS: 2.0000 | ORAL_TABLET | Freq: Every day | ORAL | Status: DC

## 2021-07-09 MED ORDER — GLIPIZIDE 5 MG PO TABS: 2.5000 mg | ORAL_TABLET | Freq: Every day | ORAL | 11 refills | Status: DC

## 2021-07-09 NOTE — Progress Notes (Signed)
Patient d/c home with infant. D/c instructions, Rx, and f/u appt given to and reviewed with pt. Pt verbalized understanding. Escorted out by auxillary.   

## 2021-08-23 NOTE — Anesthesia Postprocedure Evaluation (Signed)
Anesthesia Post Note  Patient: [name]  Procedure(s) Performed: REPEAT CESAREAN SECTION  Anesthesia Type: Spinal Pain management: satisfactory to patient Vital Signs Assessment: post-procedure vital signs reviewed and stable Respiratory status: respiratory function stable Cardiovascular status: blood pressure returned to baseline Anesthetic complications: no Comments: Patient was not seen by anesthesia prior to discharge, but had no complaints per the nursing notes.   No notable events documented.   Last Vitals:  Vitals:   07/09/21 0405 07/09/21 0814  BP: (!) 107/59 116/64  Pulse: 95 87  Resp: 18 20  Temp: 36.9 C 36.9 C  SpO2: 96% 97%    Last Pain:  Vitals:   07/09/21 0830  TempSrc:   PainSc: 6                  Lenard Simmer

## 2021-12-19 IMAGING — US US MFM OB COMPLETE +14 WKS
1 series · 15 of 28 positions shown · non-contrast
Comparison: none

[Series 1: us mfm ob complete +14 wks · 15 of 34 slices shown]
[im 1/34]
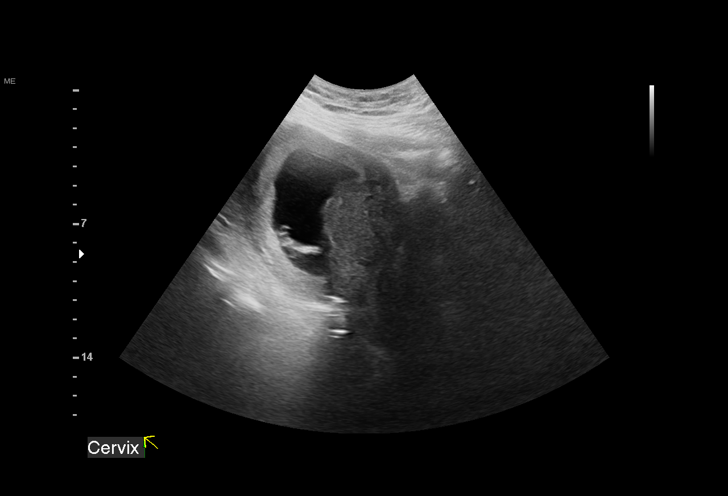
[im 3/34]
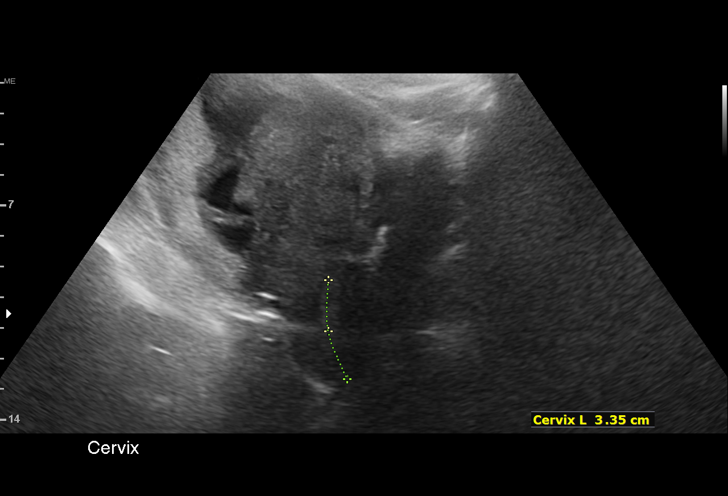
[im 5/34]
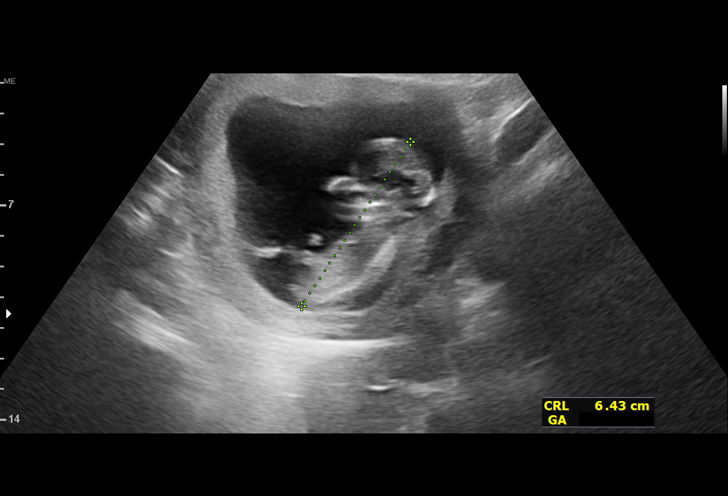
[im 8/34]
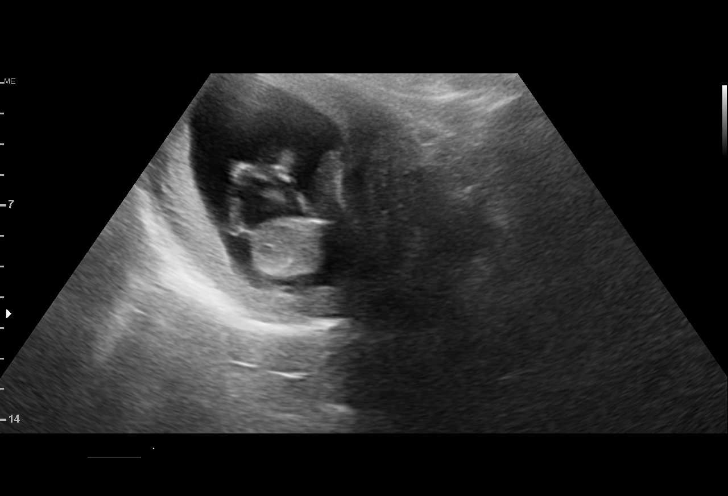
[im 10/34]
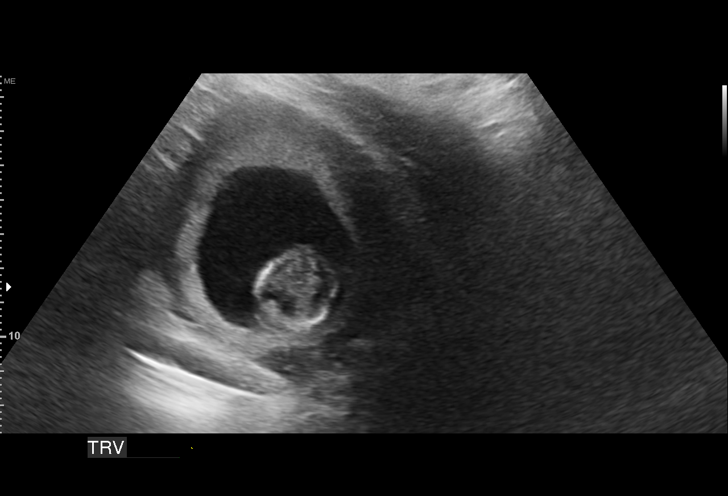
[im 13/34]
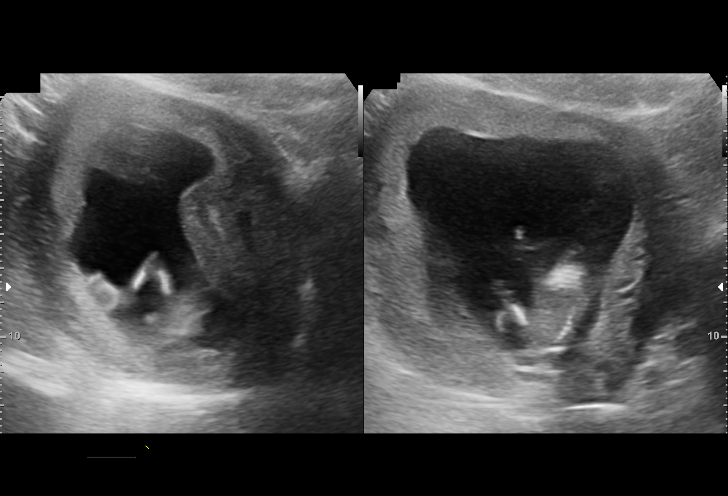
[im 15/34]
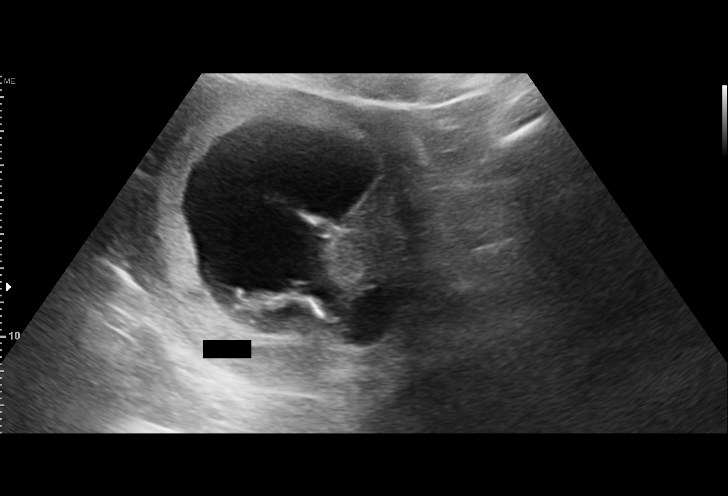
[im 18/34]
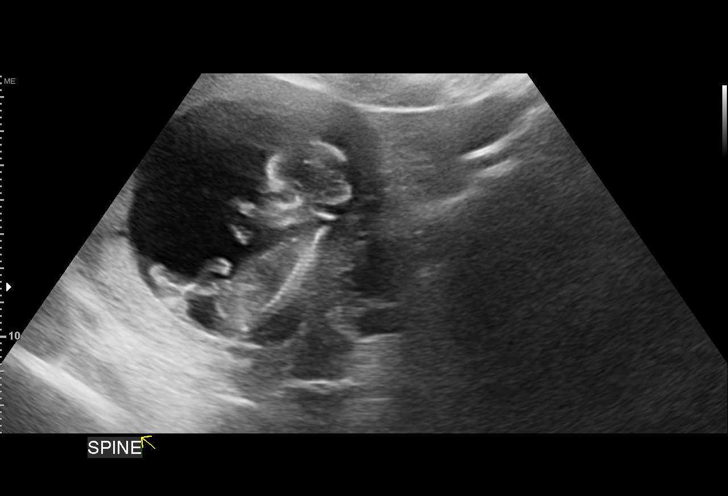
[im 19/34]
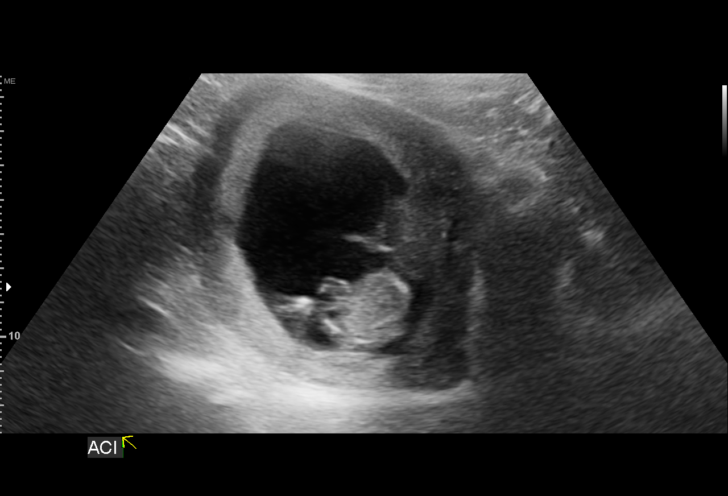
[im 21/34]
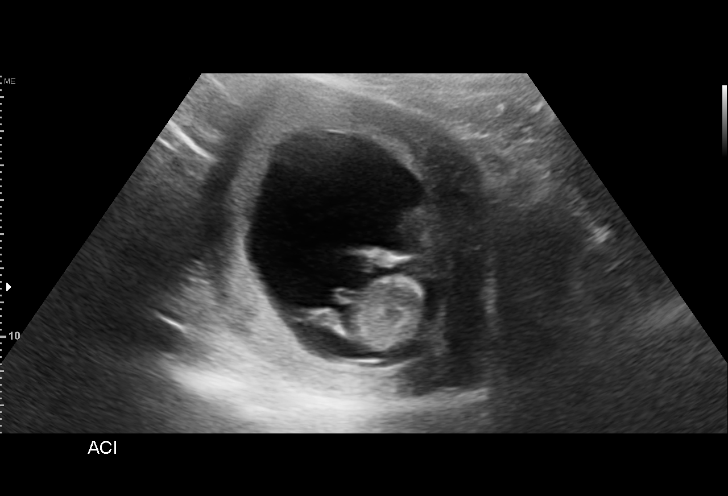
[im 24/34]
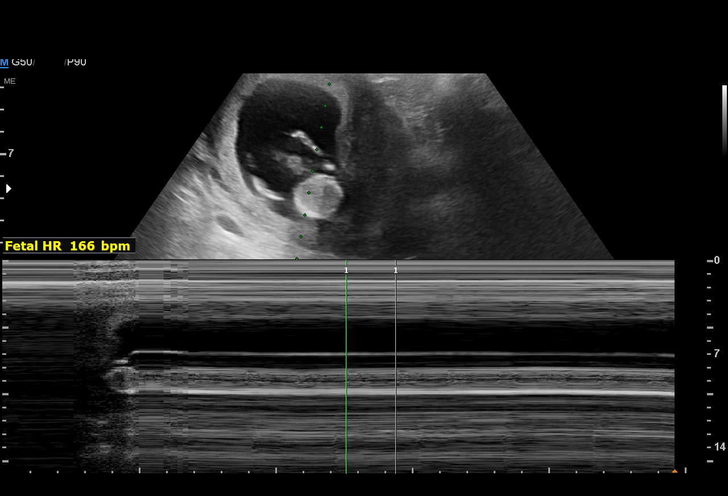
[im 26/34]
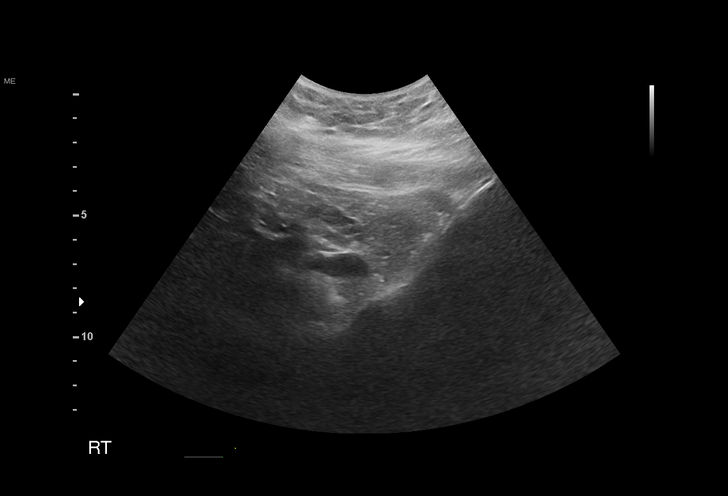
[im 29/34]
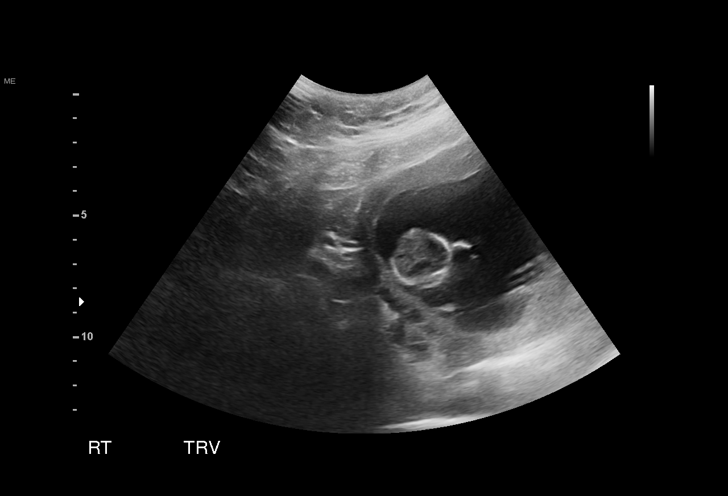
[im 31/34]
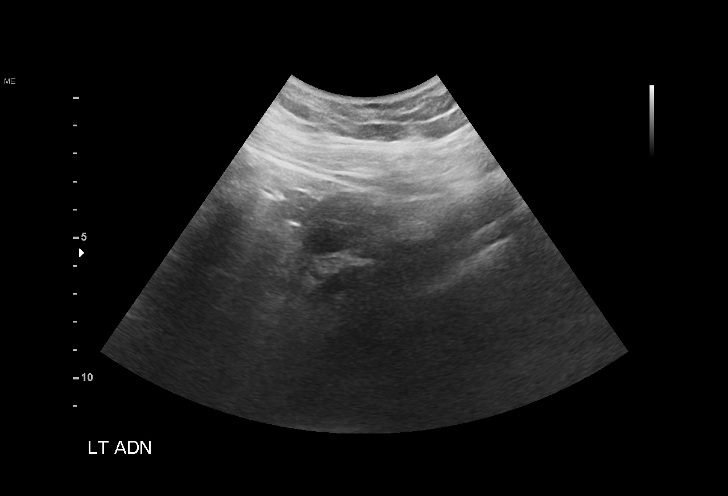
[im 34/34]
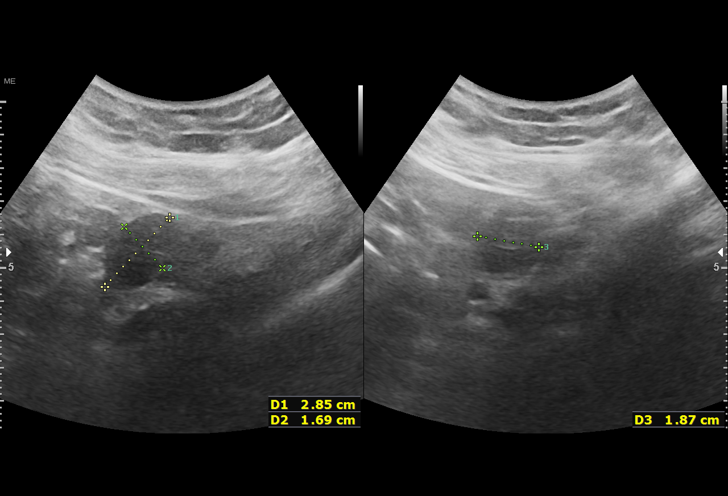

[15 of 28 positions shown; findings below may reference images not displayed]

1  US MFM OB COMP LESS THAN              76801.4     ROSALY DUDLEY
    14 WEEKS

Indications

 13 weeks gestation of pregnancy
 Obesity complicating pregnancy, first
 trimester
 Pre-existing diabetes, type 2, in pregnancy,
 first trimester
 Hypertension - Chronic/Pre-existing
Fetal Evaluation

 Num Of Fetuses:         1
 Fetal Heart Rate(bpm):  166
 Cardiac Activity:       Observed
 Presentation:           Variable
 Placenta:               Anterior
 P. Cord Insertion:      Not well visualized
Biometry

 CRL:      64.2  mm     G. Age:  12w 4d                  EDD:   07/29/21
Gestational Age

 LMP:           13w 4d        Date:  10/15/20                 EDD:   07/22/21
 Best:          13w 4d     Det. By:  LMP  (10/15/20)          EDD:   07/22/21
Cervix Uterus Adnexa

 Cervix
 Length:           3.35  cm.
Impression

 Single intrauterine pregnancy with measurements consistent
 with dates.
 The first trimester anatomy was normal, however, a detailed
 anatomy was precluded due to early gestational age.

 Therefore we have scheduled Ms. Cybernet  to return in 5-7
 weeks for a detailed exam given known controlled type 2
 Diabetes.

 An MFM Consultation was performed today. Please see [REDACTED]
 for details.
Recommendations

 Detailed anatomy was scheduled in 5-7 weeks.
 Continue daily low dose ASA
 Serial growth exams [DATE]
 Initiate weekly testing at 32 weeks
 Fetal echocardiogram between 22-26 weeks.

## 2021-12-28 DIAGNOSIS — E119 Type 2 diabetes mellitus without complications: Secondary | ICD-10-CM | POA: Diagnosis not present

## 2022-01-04 DIAGNOSIS — E119 Type 2 diabetes mellitus without complications: Secondary | ICD-10-CM | POA: Diagnosis not present

## 2022-01-04 DIAGNOSIS — Z6837 Body mass index (BMI) 37.0-37.9, adult: Secondary | ICD-10-CM | POA: Diagnosis not present

## 2022-01-04 DIAGNOSIS — Z3041 Encounter for surveillance of contraceptive pills: Secondary | ICD-10-CM | POA: Diagnosis not present

## 2022-04-13 DIAGNOSIS — E119 Type 2 diabetes mellitus without complications: Secondary | ICD-10-CM | POA: Diagnosis not present

## 2022-04-20 DIAGNOSIS — E119 Type 2 diabetes mellitus without complications: Secondary | ICD-10-CM | POA: Diagnosis not present

## 2022-04-20 DIAGNOSIS — Z6835 Body mass index (BMI) 35.0-35.9, adult: Secondary | ICD-10-CM | POA: Diagnosis not present

## 2022-04-20 DIAGNOSIS — Z3041 Encounter for surveillance of contraceptive pills: Secondary | ICD-10-CM | POA: Diagnosis not present

## 2022-06-13 DIAGNOSIS — N921 Excessive and frequent menstruation with irregular cycle: Secondary | ICD-10-CM | POA: Diagnosis not present

## 2022-06-21 DIAGNOSIS — R300 Dysuria: Secondary | ICD-10-CM | POA: Diagnosis not present

## 2022-06-21 DIAGNOSIS — N939 Abnormal uterine and vaginal bleeding, unspecified: Secondary | ICD-10-CM | POA: Diagnosis not present

## 2022-06-24 DIAGNOSIS — E119 Type 2 diabetes mellitus without complications: Secondary | ICD-10-CM | POA: Diagnosis not present

## 2022-06-24 DIAGNOSIS — M545 Low back pain, unspecified: Secondary | ICD-10-CM | POA: Diagnosis not present

## 2022-06-24 DIAGNOSIS — M47896 Other spondylosis, lumbar region: Secondary | ICD-10-CM | POA: Diagnosis not present

## 2022-06-24 DIAGNOSIS — M5459 Other low back pain: Secondary | ICD-10-CM | POA: Diagnosis not present

## 2022-07-12 DIAGNOSIS — E119 Type 2 diabetes mellitus without complications: Secondary | ICD-10-CM | POA: Diagnosis not present

## 2022-07-19 DIAGNOSIS — N926 Irregular menstruation, unspecified: Secondary | ICD-10-CM | POA: Diagnosis not present

## 2022-07-19 DIAGNOSIS — Z832 Family history of diseases of the blood and blood-forming organs and certain disorders involving the immune mechanism: Secondary | ICD-10-CM | POA: Diagnosis not present

## 2022-07-19 DIAGNOSIS — Z3041 Encounter for surveillance of contraceptive pills: Secondary | ICD-10-CM | POA: Diagnosis not present

## 2022-07-19 DIAGNOSIS — Z6834 Body mass index (BMI) 34.0-34.9, adult: Secondary | ICD-10-CM | POA: Diagnosis not present

## 2022-07-19 DIAGNOSIS — E6609 Other obesity due to excess calories: Secondary | ICD-10-CM | POA: Diagnosis not present

## 2022-07-19 DIAGNOSIS — Z23 Encounter for immunization: Secondary | ICD-10-CM | POA: Diagnosis not present

## 2022-07-19 DIAGNOSIS — E119 Type 2 diabetes mellitus without complications: Secondary | ICD-10-CM | POA: Diagnosis not present

## 2022-08-01 DIAGNOSIS — Z01419 Encounter for gynecological examination (general) (routine) without abnormal findings: Secondary | ICD-10-CM | POA: Diagnosis not present

## 2022-09-22 DIAGNOSIS — M898X1 Other specified disorders of bone, shoulder: Secondary | ICD-10-CM | POA: Diagnosis not present

## 2022-09-22 DIAGNOSIS — E119 Type 2 diabetes mellitus without complications: Secondary | ICD-10-CM | POA: Diagnosis not present

## 2022-10-18 DIAGNOSIS — E119 Type 2 diabetes mellitus without complications: Secondary | ICD-10-CM | POA: Diagnosis not present

## 2022-10-25 DIAGNOSIS — Z3041 Encounter for surveillance of contraceptive pills: Secondary | ICD-10-CM | POA: Diagnosis not present

## 2022-10-25 DIAGNOSIS — E6609 Other obesity due to excess calories: Secondary | ICD-10-CM | POA: Diagnosis not present

## 2022-10-25 DIAGNOSIS — Z1331 Encounter for screening for depression: Secondary | ICD-10-CM | POA: Diagnosis not present

## 2022-10-25 DIAGNOSIS — E559 Vitamin D deficiency, unspecified: Secondary | ICD-10-CM | POA: Insufficient documentation

## 2022-10-25 DIAGNOSIS — Z0000 Encounter for general adult medical examination without abnormal findings: Secondary | ICD-10-CM | POA: Diagnosis not present

## 2022-10-25 DIAGNOSIS — Z832 Family history of diseases of the blood and blood-forming organs and certain disorders involving the immune mechanism: Secondary | ICD-10-CM | POA: Diagnosis not present

## 2022-10-25 DIAGNOSIS — Z6832 Body mass index (BMI) 32.0-32.9, adult: Secondary | ICD-10-CM | POA: Diagnosis not present

## 2022-10-25 DIAGNOSIS — E119 Type 2 diabetes mellitus without complications: Secondary | ICD-10-CM | POA: Diagnosis not present

## 2022-12-01 DIAGNOSIS — Z03818 Encounter for observation for suspected exposure to other biological agents ruled out: Secondary | ICD-10-CM | POA: Diagnosis not present

## 2022-12-01 DIAGNOSIS — J029 Acute pharyngitis, unspecified: Secondary | ICD-10-CM | POA: Diagnosis not present

## 2022-12-01 DIAGNOSIS — E119 Type 2 diabetes mellitus without complications: Secondary | ICD-10-CM | POA: Diagnosis not present

## 2023-04-24 DIAGNOSIS — E559 Vitamin D deficiency, unspecified: Secondary | ICD-10-CM | POA: Diagnosis not present

## 2023-04-24 DIAGNOSIS — E119 Type 2 diabetes mellitus without complications: Secondary | ICD-10-CM | POA: Diagnosis not present

## 2023-05-01 DIAGNOSIS — Z3041 Encounter for surveillance of contraceptive pills: Secondary | ICD-10-CM | POA: Diagnosis not present

## 2023-05-01 DIAGNOSIS — E119 Type 2 diabetes mellitus without complications: Secondary | ICD-10-CM | POA: Diagnosis not present

## 2023-05-01 DIAGNOSIS — E559 Vitamin D deficiency, unspecified: Secondary | ICD-10-CM | POA: Diagnosis not present

## 2023-05-01 DIAGNOSIS — Z6833 Body mass index (BMI) 33.0-33.9, adult: Secondary | ICD-10-CM | POA: Diagnosis not present

## 2023-05-01 DIAGNOSIS — E6609 Other obesity due to excess calories: Secondary | ICD-10-CM | POA: Diagnosis not present

## 2023-05-01 DIAGNOSIS — Z832 Family history of diseases of the blood and blood-forming organs and certain disorders involving the immune mechanism: Secondary | ICD-10-CM | POA: Diagnosis not present

## 2023-07-12 DIAGNOSIS — N912 Amenorrhea, unspecified: Secondary | ICD-10-CM | POA: Diagnosis not present

## 2023-07-12 DIAGNOSIS — O24111 Pre-existing diabetes mellitus, type 2, in pregnancy, first trimester: Secondary | ICD-10-CM | POA: Diagnosis not present

## 2023-07-15 DIAGNOSIS — O0992 Supervision of high risk pregnancy, unspecified, second trimester: Secondary | ICD-10-CM | POA: Insufficient documentation

## 2023-07-22 ENCOUNTER — Other Ambulatory Visit: Payer: Self-pay

## 2023-07-22 ENCOUNTER — Emergency Department
Admission: EM | Admit: 2023-07-22 | Discharge: 2023-07-22 | Disposition: A | Discharge status: Home / Self Care | Payer: 59 | Attending: Emergency Medicine | Admitting: Emergency Medicine

## 2023-07-22 ENCOUNTER — Emergency Department: Payer: 59

## 2023-07-22 DIAGNOSIS — O3481 Maternal care for other abnormalities of pelvic organs, first trimester: Secondary | ICD-10-CM | POA: Diagnosis not present

## 2023-07-22 DIAGNOSIS — O24111 Pre-existing diabetes mellitus, type 2, in pregnancy, first trimester: Secondary | ICD-10-CM | POA: Insufficient documentation

## 2023-07-22 DIAGNOSIS — O4691 Antepartum hemorrhage, unspecified, first trimester: Secondary | ICD-10-CM | POA: Insufficient documentation

## 2023-07-22 DIAGNOSIS — Z3A08 8 weeks gestation of pregnancy: Secondary | ICD-10-CM | POA: Insufficient documentation

## 2023-07-22 DIAGNOSIS — Z3A01 Less than 8 weeks gestation of pregnancy: Secondary | ICD-10-CM | POA: Diagnosis not present

## 2023-07-22 DIAGNOSIS — O209 Hemorrhage in early pregnancy, unspecified: Secondary | ICD-10-CM | POA: Diagnosis not present

## 2023-07-22 DIAGNOSIS — O4690 Antepartum hemorrhage, unspecified, unspecified trimester: Secondary | ICD-10-CM

## 2023-07-22 DIAGNOSIS — Z7984 Long term (current) use of oral hypoglycemic drugs: Secondary | ICD-10-CM | POA: Diagnosis not present

## 2023-07-22 DIAGNOSIS — N8311 Corpus luteum cyst of right ovary: Secondary | ICD-10-CM | POA: Diagnosis not present

## 2023-07-22 LAB — CBC
HCT: 38.8 % (ref 36.0–46.0)
Hemoglobin: 13.2 g/dL (ref 12.0–15.0)
MCH: 28.9 pg (ref 26.0–34.0)
MCHC: 34 g/dL (ref 30.0–36.0)
MCV: 85.1 fL (ref 80.0–100.0)
Platelets: 344 10*3/uL (ref 150–400)
RBC: 4.56 MIL/uL (ref 3.87–5.11)
RDW: 13 % (ref 11.5–15.5)
WBC: 6.2 10*3/uL (ref 4.0–10.5)
nRBC: 0 % (ref 0.0–0.2)

## 2023-07-22 LAB — HCG, QUANTITATIVE, PREGNANCY: hCG, Beta Chain, Quant, S: 28526 m[IU]/mL — ABNORMAL HIGH (ref <5)

## 2023-07-22 LAB — POC URINE PREG, ED: Preg Test, Ur: POSITIVE — AB

## 2023-07-22 LAB — ABO/RH: ABO/RH(D): B POS

## 2023-07-22 NOTE — Discharge Instructions (Addendum)
You have been seen today in the emergency room for vaginal bleeding during pregnancy.  Your ultrasound today does show that there is an intrauterine embryo however there is no heartbeat identified today.  Please follow-up with your OB provider Monday morning to discuss having repeat hCG levels drawn and a repeat ultrasound.

## 2023-07-22 NOTE — ED Provider Notes (Signed)
[facility] Emergency Department Provider Note   ____________________________________________   Event Date/Time   First MD Initiated Contact with Patient 07/22/23 1014     (approximate)  I have reviewed the triage vital signs and the nursing notes.   HISTORY  Chief Complaint Vaginal Bleeding    HPI [name] is a 32 y.o. female presents to the emergency room for report of vaginal bleeding.  Patient reports that she is by OB dates [redacted] weeks pregnant by her dates almost [redacted] weeks pregnant.  Patient reports that she woke up this morning with vaginal bleeding/spotting.  She reports that she is not having any abdominal/vaginal/back pain/cramping.  This is her third child.  She is not in any pain and has not had to take any medication at this time.   Past Medical History:  Diagnosis Date   Diabetes mellitus without complication (HCC)    History of kidney stones    Kidney stone 2012   Menorrhagia    Polycystic ovarian disease    PONV (postoperative nausea and vomiting)    vomited x2 in OR during previous C-section   UTI (lower urinary tract infection)     Patient Active Problem List   Diagnosis Date Noted   Previous cesarean section 07/08/2021   Postoperative state 07/08/2021   Status post cesarean delivery 01/03/2017   Right flank pain 09/27/2016   Amenorrhea 01/02/2014   Diabetes mellitus, new onset (HCC) 12/15/2013   Screening for ischemic heart disease 12/15/2013    Past Surgical History:  Procedure Laterality Date   CESAREAN SECTION N/A 01/03/2017   Procedure: CESAREAN SECTION;  Surgeon: Conard Novak, MD;  Location: [facility] ORS;  Service: Obstetrics;  Laterality: N/A;   CESAREAN SECTION N/A 07/08/2021   Procedure: REPEAT CESAREAN SECTION;  Surgeon: Feliberto Gottron, Ihor Austin, MD;  Location: [facility] ORS;  Service: Obstetrics;  Laterality: N/A;   NO PAST SURGERIES     WISDOM TOOTH EXTRACTION      Prior to Admission medications   Medication Sig  Start Date End Date Taking? Authorizing Provider  acetaminophen (TYLENOL) 500 MG tablet Take 2 tablets (1,000 mg total) by mouth every 6 (six) hours. 07/09/21   Haroldine Laws, CNM  calcium carbonate (TUMS - DOSED IN MG ELEMENTAL CALCIUM) 500 MG chewable tablet Chew 1 tablet by mouth daily as needed for indigestion or heartburn.    [provider]  cetirizine (ZYRTEC) 10 MG tablet Take 10 mg by mouth daily.    [provider]  fluticasone (FLONASE) 50 MCG/ACT nasal spray Place 2 sprays into the nose daily as needed.    [provider]  glipiZIDE (GLUCOTROL) 5 MG tablet Take 0.5 tablets (2.5 mg total) by mouth at bedtime. 07/09/21 07/09/22  Haroldine Laws, CNM  ibuprofen (ADVIL) 600 MG tablet Take 1 tablet (600 mg total) by mouth every 6 (six) hours. 07/09/21   Haroldine Laws, CNM  metFORMIN (GLUCOPHAGE) 1000 MG tablet Take 1 tablet (1,000 mg total) by mouth 2 (two) times daily with a meal. 07/09/21 07/09/22  Haroldine Laws, CNM  Prenatal Vit-Fe Fumarate-FA (MULTIVITAMIN-PRENATAL) 27-0.8 MG TABS tablet Take 1 tablet by mouth daily.    [provider]  senna-docusate (SENOKOT-S) 8.6-50 MG tablet Take 2 tablets by mouth daily. 07/10/21   Haroldine Laws, CNM  simethicone (MYLICON) 80 MG chewable tablet Chew 1 tablet (80 mg total) by mouth 3 (three) times daily after meals. 07/09/21   Haroldine Laws, CNM    Allergies Patient has no known allergies.  Family History  Problem Relation Age of Onset   Heart disease Maternal Grandmother    Thyroid cancer Maternal Grandmother    Diabetes type II Paternal Grandfather    Arthritis Paternal Grandmother    Hypertension Paternal Grandmother    Diabetes Paternal Grandmother    Diabetes Mother     Social History Social History   Tobacco Use   Smoking status: Never   Smokeless tobacco: Never  Vaping Use   Vaping status: Never Used  Substance Use Topics   Alcohol use: No   Drug use: No    Review of  Systems  Constitutional: No fever/chills Cardiovascular: Denies chest pain. Respiratory: Denies shortness of breath. Gastrointestinal: No abdominal pain.  No nausea, no vomiting.  No diarrhea.   Genitourinary: Negative for dysuria.  Report of vaginal bleeding with pregnancy. Musculoskeletal: Negative for back pain. Skin: Negative for rash. Neurological: Negative for headaches, focal weakness or numbness.   ____________________________________________   PHYSICAL EXAM:  VITAL SIGNS: ED Triage Vitals  Encounter Vitals Group     BP 07/22/23 1008 122/80     Systolic BP Percentile --      Diastolic BP Percentile --      Pulse Rate 07/22/23 1008 80     Resp 07/22/23 1008 17     Temp 07/22/23 1010 98.1 F (36.7 C)     Temp Source 07/22/23 1010 Oral     SpO2 07/22/23 1008 100 %     Weight 07/22/23 1008 240 lb 1.3 oz (108.9 kg)     Height 07/22/23 1008 5\' 4"  (1.626 m)     Head Circumference --      Peak Flow --      Pain Score 07/22/23 1008 0     Pain Loc --      Pain Education --      Exclude from Growth Chart --     Constitutional: Alert and oriented. Well appearing and in no acute distress. Head: Atraumatic. Cardiovascular: Normal rate, regular rhythm. Grossly normal heart sounds.  Good peripheral circulation. Respiratory: Normal respiratory effort.  No retractions. Lungs CTAB. Gastrointestinal: Soft and nontender. No distention.  Musculoskeletal: No lower extremity tenderness nor edema.   Neurologic:  Normal speech and language. No gross focal neurologic deficits are appreciated. No gait instability. Skin:  Skin is warm, dry and intact. No rash noted. Psychiatric: Mood and affect are normal. Speech and behavior are normal.  ____________________________________________   LABS (all labs ordered are listed, but only abnormal results are displayed)  Labs Reviewed  HCG, QUANTITATIVE, PREGNANCY - Abnormal; Notable for the following components:      Result Value   hCG,  Beta Chain, Quant, S 28,526 (*)    All other components within normal limits  POC URINE PREG, ED - Abnormal; Notable for the following components:   Preg Test, Ur POSITIVE (*)    All other components within normal limits  CBC  ABO/RH   ____________________________________________  EKG   ____________________________________________  RADIOLOGY  ED MD interpretation: Ultrasound results reviewed by me and read by radiologist.  Official radiology report(s): US OB LESS THAN 14 WEEKS WITH OB TRANSVAGINAL  Result Date: 07/22/2023 CLINICAL DATA:  Vaginal bleeding. Early pregnancy. Estimated gestational age outside first-trimester ultrasound equal 7 weeks 5 days. EXAM: OBSTETRIC <14 WK [place] AND TRANSVAGINAL OB US TECHNIQUE: Both transabdominal and transvaginal ultrasound examinations were performed for complete evaluation of the gestation as well as the maternal uterus, adnexal regions, and pelvic cul-de-sac. Transvaginal technique was performed  to assess early pregnancy. COMPARISON:  None Available. FINDINGS: Intrauterine gestational sac: Present Yolk sac:  Present (8 mm) Embryo:  Present Cardiac Activity: Not identified CRL:  6.8 mm   6 w   4 d                  [place] EDC: 03/12/2024 Subchorionic hemorrhage:  None visualized. Maternal uterus/adnexae: Corpus luteal cyst of the RIGHT ovary. LEFT ovary normal. No free fluid. IMPRESSION: 1. Single intrauterine gestation with embryo and enlarged yolk sac. No fetal heart rate demonstrated. Finding suspicious but not definitive for non-viability. 2. Corpus luteal cyst of the RIGHT ovary. 3. No free fluid Electronically Signed   By: Genevive Bi M.D.   On: 07/22/2023 12:51    ____________________________________________   PROCEDURES  Procedure(s) performed: None  Procedures  Critical Care performed: No  ____________________________________________   INITIAL IMPRESSION / ASSESSMENT AND PLAN / ED COURSE     [name] is a 33 y.o. female  presents to the emergency room for report of vaginal bleeding.  Patient reports that she is by OB dates [redacted] weeks pregnant by her dates almost [redacted] weeks pregnant.  Patient reports that she woke up this morning with vaginal bleeding/spotting.  She reports that she is not having any abdominal/vaginal/back pain/cramping.  This is her third child.  She is not in any pain and has not had to take any medication at this time.  Beta-hCG is 28,526. Hemoglobin stable at 13.2. Will obtain transvaginal ultrasound.  Ultrasound shows 1. Single intrauterine gestation with embryo and enlarged yolk sac.  No fetal heart rate demonstrated. Finding suspicious but not  definitive for non-viability.  2. Corpus luteal cyst of the RIGHT ovary.  3. No free fluid    Discussed findings of ultrasound with patient. Recommend that she follow-up with OB Monday morning and have follow-up hCG levels as well as repeat ultrasound. Patient verbalized understanding. Red flags for return to the emergency room discussed with patient.  Will discharge home in stable condition at this time.     ____________________________________________   FINAL CLINICAL IMPRESSION(S) / ED DIAGNOSES  Final diagnoses:  Vaginal bleeding in pregnancy     ED Discharge Orders     None        Note:  This document was prepared using Dragon voice recognition software and may include unintentional dictation errors.     Herschell Dimes, NP 07/22/23 1329    Minna Antis, MD 07/22/23 1434

## 2023-07-22 NOTE — ED Triage Notes (Signed)
Pt here after having 1 episode of brownish bleeding this mooring. Pt is currently [redacted] weeks pregnant. Pt denies abd pain. Pt denies clots in the blood.

## 2023-07-23 DIAGNOSIS — O021 Missed abortion: Secondary | ICD-10-CM | POA: Diagnosis not present

## 2023-07-23 DIAGNOSIS — E1169 Type 2 diabetes mellitus with other specified complication: Secondary | ICD-10-CM | POA: Diagnosis not present

## 2023-07-23 DIAGNOSIS — O209 Hemorrhage in early pregnancy, unspecified: Secondary | ICD-10-CM | POA: Diagnosis not present

## 2023-07-23 DIAGNOSIS — O24111 Pre-existing diabetes mellitus, type 2, in pregnancy, first trimester: Secondary | ICD-10-CM | POA: Diagnosis not present

## 2023-07-26 ENCOUNTER — Other Ambulatory Visit: Payer: Self-pay

## 2023-07-26 ENCOUNTER — Emergency Department: Payer: 59

## 2023-07-26 ENCOUNTER — Encounter: Payer: Self-pay | Admitting: *Deleted

## 2023-07-26 ENCOUNTER — Inpatient Hospital Stay
Admission: EM | Admit: 2023-07-26 | Discharge: 2023-07-27 | DRG: 770 | Disposition: A | Discharge status: Home / Self Care | Payer: 59 | Attending: Certified Nurse Midwife | Admitting: Certified Nurse Midwife

## 2023-07-26 DIAGNOSIS — O034 Incomplete spontaneous abortion without complication: Secondary | ICD-10-CM | POA: Diagnosis not present

## 2023-07-26 DIAGNOSIS — Z808 Family history of malignant neoplasm of other organs or systems: Secondary | ICD-10-CM

## 2023-07-26 DIAGNOSIS — Z8249 Family history of ischemic heart disease and other diseases of the circulatory system: Secondary | ICD-10-CM

## 2023-07-26 DIAGNOSIS — Z3A01 Less than 8 weeks gestation of pregnancy: Secondary | ICD-10-CM

## 2023-07-26 DIAGNOSIS — O3680X0 Pregnancy with inconclusive fetal viability, not applicable or unspecified: Secondary | ICD-10-CM | POA: Diagnosis not present

## 2023-07-26 DIAGNOSIS — N939 Abnormal uterine and vaginal bleeding, unspecified: Principal | ICD-10-CM

## 2023-07-26 DIAGNOSIS — R9389 Abnormal findings on diagnostic imaging of other specified body structures: Secondary | ICD-10-CM | POA: Diagnosis not present

## 2023-07-26 DIAGNOSIS — O021 Missed abortion: Secondary | ICD-10-CM | POA: Diagnosis not present

## 2023-07-26 DIAGNOSIS — O039 Complete or unspecified spontaneous abortion without complication: Secondary | ICD-10-CM | POA: Diagnosis not present

## 2023-07-26 DIAGNOSIS — Z7984 Long term (current) use of oral hypoglycemic drugs: Secondary | ICD-10-CM

## 2023-07-26 DIAGNOSIS — Z8261 Family history of arthritis: Secondary | ICD-10-CM

## 2023-07-26 DIAGNOSIS — Z87442 Personal history of urinary calculi: Secondary | ICD-10-CM | POA: Diagnosis not present

## 2023-07-26 DIAGNOSIS — Z833 Family history of diabetes mellitus: Secondary | ICD-10-CM | POA: Diagnosis not present

## 2023-07-26 DIAGNOSIS — O209 Hemorrhage in early pregnancy, unspecified: Secondary | ICD-10-CM | POA: Diagnosis not present

## 2023-07-26 DIAGNOSIS — Z3A00 Weeks of gestation of pregnancy not specified: Secondary | ICD-10-CM | POA: Diagnosis not present

## 2023-07-26 DIAGNOSIS — O99211 Obesity complicating pregnancy, first trimester: Secondary | ICD-10-CM | POA: Diagnosis not present

## 2023-07-26 LAB — BASIC METABOLIC PANEL
Anion gap: 11 (ref 5–15)
BUN: 8 mg/dL (ref 6–20)
CO2: 23 mmol/L (ref 22–32)
Calcium: 9.4 mg/dL (ref 8.9–10.3)
Chloride: 103 mmol/L (ref 98–111)
Creatinine, Ser: 0.55 mg/dL (ref 0.44–1.00)
GFR, Estimated: >60 mL/min (ref >60)
Glucose, Bld: 112 mg/dL — ABNORMAL HIGH (ref 70–99)
Potassium: 4.5 mmol/L (ref 3.5–5.1)
Sodium: 137 mmol/L (ref 135–145)

## 2023-07-26 LAB — CBC
HCT: 36.2 % (ref 36.0–46.0)
HCT: 31.9 % — ABNORMAL LOW (ref 36.0–46.0)
Hemoglobin: 12.3 g/dL (ref 12.0–15.0)
Hemoglobin: 11.2 g/dL — ABNORMAL LOW (ref 12.0–15.0)
MCH: 28.9 pg (ref 26.0–34.0)
MCH: 29.2 pg (ref 26.0–34.0)
MCHC: 34 g/dL (ref 30.0–36.0)
MCHC: 35.1 g/dL (ref 30.0–36.0)
MCV: 85 fL (ref 80.0–100.0)
MCV: 83.1 fL (ref 80.0–100.0)
Platelets: 380 10*3/uL (ref 150–400)
Platelets: 327 K/uL (ref 150–400)
RBC: 4.26 MIL/uL (ref 3.87–5.11)
RBC: 3.84 MIL/uL — ABNORMAL LOW (ref 3.87–5.11)
RDW: 13 % (ref 11.5–15.5)
RDW: 12.9 % (ref 11.5–15.5)
WBC: 10.1 10*3/uL (ref 4.0–10.5)
WBC: 8.8 K/uL (ref 4.0–10.5)
nRBC: 0 % (ref 0.0–0.2)
nRBC: 0 % (ref 0.0–0.2)

## 2023-07-26 LAB — CBG MONITORING, ED: Glucose-Capillary: 98 mg/dL (ref 70–99)

## 2023-07-26 LAB — HCG, QUANTITATIVE, PREGNANCY: hCG, Beta Chain, Quant, S: 16555 m[IU]/mL — ABNORMAL HIGH

## 2023-07-26 LAB — SAMPLE TO BLOOD BANK

## 2023-07-26 MED ORDER — ACETAMINOPHEN 500 MG PO TABS: 1000.0000 mg | ORAL_TABLET | Freq: Four times a day (QID) | ORAL | Status: DC | PRN

## 2023-07-26 MED ORDER — ONDANSETRON HCL 4 MG PO TABS
4.0000 mg | ORAL_TABLET | Freq: Four times a day (QID) | ORAL | Status: DC | PRN
  Administered 2023-07-27: 4 mg via ORAL
  Filled 2023-07-26: qty 1

## 2023-07-26 MED ORDER — METHYLERGONOVINE MALEATE 0.2 MG PO TABS
0.2000 mg | ORAL_TABLET | Freq: Four times a day (QID) | ORAL | Status: DC
  Administered 2023-07-27 (×2): 0.2 mg via ORAL
  Filled 2023-07-26 (×2): qty 1

## 2023-07-26 MED ORDER — LACTATED RINGERS IV SOLN: INTRAVENOUS | Status: DC

## 2023-07-26 MED ORDER — PRENATAL MULTIVITAMIN CH: 1.0000 | ORAL_TABLET | Freq: Every day | ORAL | Status: DC

## 2023-07-26 MED ORDER — TRANEXAMIC ACID-NACL 1000-0.7 MG/100ML-% IV SOLN
1000.0000 mg | INTRAVENOUS | Status: AC
  Administered 2023-07-27: 1000 mg via INTRAVENOUS
  Filled 2023-07-26: qty 100

## 2023-07-26 MED ORDER — ONDANSETRON HCL 4 MG/2ML IJ SOLN
4.0000 mg | Freq: Four times a day (QID) | INTRAMUSCULAR | Status: DC | PRN
  Administered 2023-07-27: 4 mg via INTRAVENOUS
  Filled 2023-07-26: qty 2

## 2023-07-26 MED ORDER — IBUPROFEN 600 MG PO TABS
600.0000 mg | ORAL_TABLET | Freq: Once | ORAL | Status: AC
  Administered 2023-07-26: 600 mg via ORAL
  Filled 2023-07-26: qty 1

## 2023-07-26 MED ORDER — IBUPROFEN 600 MG PO TABS
600.0000 mg | ORAL_TABLET | Freq: Four times a day (QID) | ORAL | Status: DC | PRN
  Administered 2023-07-27: 600 mg via ORAL
  Filled 2023-07-26: qty 1

## 2023-07-26 MED ORDER — TRANEXAMIC ACID 650 MG PO TABS
1300.0000 mg | ORAL_TABLET | Freq: Three times a day (TID) | ORAL | Status: DC
  Filled 2023-07-26: qty 2

## 2023-07-26 MED ORDER — MORPHINE SULFATE (PF) 2 MG/ML IV SOLN: 1.0000 mg | INTRAVENOUS | Status: DC | PRN

## 2023-07-26 NOTE — ED Notes (Signed)
Pt up to bathroom and back to bed with steady gait. Pt needed new personal care items. RN visualized a clot the size of a little smaller than a golf ball.

## 2023-07-26 NOTE — H&P (Signed)
Consult History and Physical   SERVICE: Gynecology    Patient Name: [name] Patient MRN:   [id number]  CC: missed abortion, vaginal bleeding, retained products of conception  HPI: [name] is a 32 y.o. [id number] with heavy vaginal bleeding after being diagnosed with a missed abortion on 07/23/23. She is supposed to be about 12wks but was measuring 6wks without a heartbeat. She opted for expectant management and began bleeding on 07/25/23. The bleeding became very heavy this evening accompanied with 10/10 abdominal/pelvic pain and she came to the ER for evaluation. She received a TVUS which showed likely retained products of conception and began to bleed heavier after the [place]. She was passing many large fist-sized clots. She denied dizziness. She is still reporting continued heavy bleeding.   Review of Systems: positives in bold GEN:   fevers, chills, weight changes, appetite changes, fatigue, night sweats HEENT:  HA, vision changes, hearing loss, congestion, rhinorrhea, sinus pressure, dysphagia CV:   CP, palpitations PULM:  SOB, cough GI:  abd pain, N/V/D/C GU:  dysuria, urgency, frequency GYN: cramping, bleeding, pelvic pain MSK:  arthralgias, myalgias, back pain, swelling SKIN:  rashes, color changes, pallor NEURO:  numbness, weakness, tingling, seizures, dizziness, tremors PSYCH:  depression, anxiety, behavioral problems, confusion  HEME/LYMPH:  easy bruising or bleeding ENDO:  heat/cold intolerance  Past Obstetrical History: OB History     Gravida  3   Para  2   Term  2   Preterm  0   AB  0   Living  2      SAB  0   IAB  0   Ectopic  0   Multiple  0   Live Births  2           Past Gynecologic History: Patient's last menstrual period was 05/18/2023 (exact date).   Past Medical History: Past Medical History:  Diagnosis Date   Diabetes mellitus without complication (HCC)    History of kidney stones    Kidney stone 2012   Menorrhagia     Polycystic ovarian disease    PONV (postoperative nausea and vomiting)    vomited x2 in OR during previous C-section   UTI (lower urinary tract infection)     Past Surgical History:   Past Surgical History:  Procedure Laterality Date   CESAREAN SECTION N/A 01/03/2017   Procedure: CESAREAN SECTION;  Surgeon: Conard Novak, MD;  Location: [facility] ORS;  Service: Obstetrics;  Laterality: N/A;   CESAREAN SECTION N/A 07/08/2021   Procedure: REPEAT CESAREAN SECTION;  Surgeon: Feliberto Gottron, Ihor Austin, MD;  Location: [facility] ORS;  Service: Obstetrics;  Laterality: N/A;   NO PAST SURGERIES     WISDOM TOOTH EXTRACTION      Family History:  family history includes Arthritis in her paternal grandmother; Diabetes in her mother and paternal grandmother; Diabetes type II in her paternal grandfather; Heart disease in her maternal grandmother; Hypertension in her paternal grandmother; Thyroid cancer in her maternal grandmother.  Social History:  Social History   Socioeconomic History   Marital status: Married    Spouse name: [name]   Number of children: Not on file   Years of education: Not on file   Highest education level: Not on file  Occupational History   Occupation: Airline pilot: [employer]    Comment: mgr  Tobacco Use   Smoking status: Never   Smokeless tobacco: Never  Vaping Use   Vaping status: Never Used  Substance and Sexual  Activity   Alcohol use: No   Drug use: No   Sexual activity: Yes  Other Topics Concern   Not on file  Social History Narrative   ** Merged History Encounter **       [employer] billing specialist   Lives with step dad.   Social Determinants of Health   Financial Resource Strain: Low Risk  (10/25/2022)   Received from [facility]   Overall Financial Resource Strain (CARDIA)    Difficulty of Paying Living Expenses: Not very hard  Food Insecurity: No Food Insecurity (10/25/2022)   Received from [facility]   Hunger  Vital Sign    Worried About Running Out of Food in the Last Year: Never true    Ran Out of Food in the Last Year: Never true  Transportation Needs: No Transportation Needs (10/25/2022)   Received from [facility] - Transportation    In the past 12 months, has lack of transportation kept you from medical appointments or from getting medications?: No    Lack of Transportation (Non-Medical): No  Physical Activity: Not on file  Stress: Not on file  Social Connections: Not on file  Intimate Partner Violence: Not on file    Home Medications:  Medications reconciled in EPIC  No current facility-administered medications on file prior to encounter.   Current Outpatient Medications on File Prior to Encounter  Medication Sig Dispense Refill   acetaminophen (TYLENOL) 500 MG tablet Take 2 tablets (1,000 mg total) by mouth every 6 (six) hours. 30 tablet 0   calcium carbonate (TUMS - DOSED IN MG ELEMENTAL CALCIUM) 500 MG chewable tablet Chew 1 tablet by mouth daily as needed for indigestion or heartburn.     cetirizine (ZYRTEC) 10 MG tablet Take 10 mg by mouth daily.     fluticasone (FLONASE) 50 MCG/ACT nasal spray Place 2 sprays into the nose daily as needed.     glipiZIDE (GLUCOTROL) 5 MG tablet Take 0.5 tablets (2.5 mg total) by mouth at bedtime. 15 tablet 11   ibuprofen (ADVIL) 600 MG tablet Take 1 tablet (600 mg total) by mouth every 6 (six) hours.     metFORMIN (GLUCOPHAGE) 1000 MG tablet Take 1 tablet (1,000 mg total) by mouth 2 (two) times daily with a meal. 60 tablet 11   Prenatal Vit-Fe Fumarate-FA (MULTIVITAMIN-PRENATAL) 27-0.8 MG TABS tablet Take 1 tablet by mouth daily.     senna-docusate (SENOKOT-S) 8.6-50 MG tablet Take 2 tablets by mouth daily.     simethicone (MYLICON) 80 MG chewable tablet Chew 1 tablet (80 mg total) by mouth 3 (three) times daily after meals. 30 tablet 0    Allergies:  No Known Allergies  Physical Exam:  Temp:  [98 F (36.7 C)-98.2  F (36.8 C)] 98 F (36.7 C) (10/03 2130) Pulse Rate:  [92-98] 92 (10/03 2130) Resp:  [20] 20 (10/03 2130) BP: (136)/(73-88) 136/73 (10/03 2130) SpO2:  [100 %] 100 % (10/03 2130) Weight:  [86.2 kg] 86.2 kg (10/03 1833)   General Appearance:  Well developed, well nourished, no acute distress, alert and oriented x3 HEENT:  Normocephalic atraumatic, extraocular movements intact, moist mucous membranes Cardiovascular:  Normal S1/S2, regular rate and rhythm, no murmurs Pulmonary:  clear to auscultation, no wheezes, rales or rhonchi, symmetric air entry, good air exchange Abdomen:  Bowel sounds present, soft, nontender, nondistended, no abnormal masses, no epigastric pain Extremities:  Full range of motion, no pedal edema, 2+ distal pulses,  no tenderness Skin:  normal coloration and turgor, no rashes, no suspicious skin lesions noted  Neurologic:  Cranial nerves 2-12 grossly intact, normal muscle tone, strength 5/5 all four extremities Psychiatric:  Normal mood and affect, appropriate, no AH/VH  Labs/Studies:   CBC and Coags:  Lab Results  Component Value Date   WBC 10.1 07/26/2023   NEUTOPHILPCT 53 12/15/2013   HGB 12.3 07/26/2023   HCT 36.2 07/26/2023   MCV 85.0 07/26/2023   PLT 380 07/26/2023   CMP:  Lab Results  Component Value Date   NA 137 07/26/2023   K 4.5 07/26/2023   CL 103 07/26/2023   CO2 23 07/26/2023   BUN 8 07/26/2023   CREATININE 0.55 07/26/2023   CREATININE 0.40 (L) 07/08/2021   CREATININE 0.69 12/15/2013   PROT 6.8 12/15/2013   BILITOT <0.2 12/15/2013   ALT 19 12/15/2013   AST 16 12/15/2013   ALKPHOS 60 12/15/2013   Other Labs: Ordered repeat CBC q6hrs ABO/Rh ordered CBG now then every 4hrs  TVUS:    Other Imaging: US OB LESS THAN 14 WEEKS WITH OB TRANSVAGINAL  Result Date: 07/26/2023 CLINICAL DATA:  Bleeding, recent miscarriage EXAM: OBSTETRIC <14 WK [place] AND TRANSVAGINAL OB US TECHNIQUE: Both transabdominal and transvaginal ultrasound examinations  were performed for complete evaluation of the gestation as well as the maternal uterus, adnexal regions, and pelvic cul-de-sac. Transvaginal technique was performed to assess early pregnancy. COMPARISON:  07/22/2023 FINDINGS: Intrauterine gestational sac: None Yolk sac:  Not Visualized. Embryo:  Not Visualized. Cardiac Activity: Not Visualized. Heart Rate:   bpm MSD:   mm    w     d CRL:    mm    w    d                  [place] EDC: Subchorionic hemorrhage:  None visualized. Maternal uterus/adnexae: Endometrium is thickened at 20 mm, complex and heterogeneous. Internal blood flow. Findings compatible with retained products of conception. No adnexal mass or free fluid. IMPRESSION: Thickened, heterogeneous, hypervascular endometrium concerning for retained products of conception. Electronically Signed   By: Charlett Nose M.D.   On: 07/26/2023 21:30   US OB LESS THAN 14 WEEKS WITH OB TRANSVAGINAL  Result Date: 07/22/2023 CLINICAL DATA:  Vaginal bleeding. Early pregnancy. Estimated gestational age outside first-trimester ultrasound equal 7 weeks 5 days. EXAM: OBSTETRIC <14 WK [place] AND TRANSVAGINAL OB US TECHNIQUE: Both transabdominal and transvaginal ultrasound examinations were performed for complete evaluation of the gestation as well as the maternal uterus, adnexal regions, and pelvic cul-de-sac. Transvaginal technique was performed to assess early pregnancy. COMPARISON:  None Available. FINDINGS: Intrauterine gestational sac: Present Yolk sac:  Present (8 mm) Embryo:  Present Cardiac Activity: Not identified CRL:  6.8 mm   6 w   4 d                  [place] EDC: 03/12/2024 Subchorionic hemorrhage:  None visualized. Maternal uterus/adnexae: Corpus luteal cyst of the RIGHT ovary. LEFT ovary normal. No free fluid. IMPRESSION: 1. Single intrauterine gestation with embryo and enlarged yolk sac. No fetal heart rate demonstrated. Finding suspicious but not definitive for non-viability. 2. Corpus luteal cyst of the RIGHT ovary. 3.  No free fluid Electronically Signed   By: Genevive Bi M.D.   On: 07/22/2023 12:51     Assessment / Plan:   [name] is a 32 y.o. [id number] who presents with missed abortion, retained products of conception, and vaginal  bleeding  1. Admit to Mother/Baby unit. Discussed D&C vs medication management. She is not dizzy or symptomatic for blood loss at this time so elects for medication management overnight, unless bleeding remains heavy or her hemoglobin drops significantly or she becomes symptomatic. Will give TXA 1000mg  IV x1, Methergine 0.2mg  PO q6hrs.  2. Check CBC now then q6hrs. Initial Hgb around 1830 was 12. 3.  NPO at this time. 4. Diabetes management: NPO at this time, will check CBG now then q4hrs with diabetes care plan initiated for hypoglycemia. 5. Continuous LR IVF at 158ml/hr. 6. Pain management: Ibuprofen 600mg  q6hrs PRN cramping/pain, Morphine 2mg  IV q4hrs severe pain. 7. Dr. Dalbert Garnet updated and agreeable on plan of care.   Janyce Llanos, CNM 07/26/2023 11:36 PM

## 2023-07-26 NOTE — ED Notes (Signed)
Report given to Lurena Joiner, Charity fundraiser. She is aware of outstanding medications.

## 2023-07-26 NOTE — ED Notes (Signed)
Pt ambulated back to room with steady gait. Pt states she lost a lot of blood and her clothes were completely soaked. RN visualized two clots about the size of a half dollar. Pt in 10/10 pain; MD notified.

## 2023-07-26 NOTE — ED Notes (Signed)
RN at bedside assisting MD with pelvic exam.

## 2023-07-26 NOTE — ED Notes (Signed)
OB at bedside

## 2023-07-26 NOTE — ED Provider Notes (Signed)
[facility] Provider Note    Event Date/Time   First MD Initiated Contact with Patient 07/26/23 2109     (approximate)   History   Vaginal Bleeding   HPI  [name] is a 32 y.o. female who presented to the ER with heavy vaginal bleeding in the setting of miscarriage.  Had been doing expectant management followed by [facility] OB.  Has been having through bleeding but it became much more severe over the past 24 hours having crampy pain.  Felt she was passing some tissue but having bleeding to the point where she is saturating multiple pads per hour for multiple hours.  She describing pain is mild to moderate and cramping in nature.     Physical Exam   Triage Vital Signs: ED Triage Vitals [07/26/23 1833]  Encounter Vitals Group     BP 136/88     Systolic BP Percentile      Diastolic BP Percentile      Pulse Rate 98     Resp 20     Temp 98.2 F (36.8 C)     Temp Source Oral     SpO2 100 %     Weight 190 lb (86.2 kg)     Height 5\' 4"  (1.626 m)     Head Circumference      Peak Flow      Pain Score 10     Pain Loc      Pain Education      Exclude from Growth Chart     Most recent vital signs: Vitals:   07/26/23 1833 07/26/23 2130  BP: 136/88 136/73  Pulse: 98 92  Resp: 20 20  Temp: 98.2 F (36.8 C) 98 F (36.7 C)  SpO2: 100% 100%     Constitutional: Alert  Eyes: Conjunctivae are normal.  Head: Atraumatic. Nose: No congestion/rhinnorhea. Mouth/Throat: Mucous membranes are moist.   Neck: Painless ROM.  Cardiovascular:   Good peripheral circulation. Respiratory: Normal respiratory effort.  No retractions.  Gastrointestinal: Soft and nontender.  Pelvic exam chaperoned by RN Price with moderate to significant amount of bleeding and clot.  No retained pocket in the cervix. Musculoskeletal:  no deformity Neurologic:  MAE spontaneously. No gross focal neurologic deficits are appreciated.  Skin:  Skin is warm, dry and intact. No  rash noted. Psychiatric: Mood and affect are normal. Speech and behavior are normal.    ED Results / Procedures / Treatments   Labs (all labs ordered are listed, but only abnormal results are displayed) Labs Reviewed  BASIC METABOLIC PANEL - Abnormal; Notable for the following components:      Result Value   Glucose, Bld 112 (*)    All other components within normal limits  HCG, QUANTITATIVE, PREGNANCY - Abnormal; Notable for the following components:   hCG, Beta Chain, Quant, S 16,555 (*)    All other components within normal limits  CBC  CBC  SAMPLE TO BLOOD BANK     EKG     RADIOLOGY Please see ED Course for my review and interpretation.  I personally reviewed all radiographic images ordered to evaluate for the above acute complaints and reviewed radiology reports and findings.  These findings were personally discussed with the patient.  Please see medical record for radiology report.    PROCEDURES:  Critical Care performed:   Procedures   MEDICATIONS ORDERED IN ED: Medications  tranexamic acid (CYKLOKAPRON) IVPB 1,000 mg (has no administration in time range)  ibuprofen (  ADVIL) tablet 600 mg (600 mg Oral Given 07/26/23 2213)     IMPRESSION / MDM / ASSESSMENT AND PLAN / ED COURSE  I reviewed the triage vital signs and the nursing notes.                              Differential diagnosis includes, but is not limited to, DU B, AUB, anemia, incomplete AB, miscarriage  Patient presenting to the ER for evaluation of symptoms as described above.  Based on symptoms, risk factors and considered above differential, this presenting complaint could reflect a potentially life-threatening illness therefore the patient will be placed on continuous pulse oximetry and telemetry for monitoring.  Laboratory evaluation will be sent to evaluate for the above complaints.      Clinical Course as of 07/26/23 2319  Thu Jul 26, 2023  2250 I discussed case in consultation with  Dr. Reynaldo Minium nurse midwife.  She will evaluate patient.  Has recommended give a dose of Stieda.  Patient remains hemodynamically stable at this time. [PR]    Clinical Course User Index [PR] Willy Eddy, MD   Patient evaluated by OB at bedside.  Will try additional conservative management including IV TXA possibly Methergine.  Will observe overnight for serial H&H on L&D.  Patient is stable for admission.   FINAL CLINICAL IMPRESSION(S) / ED DIAGNOSES   Final diagnoses:  Vaginal bleeding     Rx / DC Orders   ED Discharge Orders     None        Note:  This document was prepared using Dragon voice recognition software and may include unintentional dictation errors.    Willy Eddy, MD 07/26/23 [id number]

## 2023-07-26 NOTE — ED Notes (Signed)
Pt in bathroom at this time getting changed. Personal care items provided.

## 2023-07-26 NOTE — ED Triage Notes (Signed)
Pt reports miscarriage this week.  Pt continues to have vaginal bleeding.  Pt states going thru 4 pads an hour.  Pt reports passing clots of blood.  Pt tearful

## 2023-07-27 ENCOUNTER — Encounter
Admission: EM | Disposition: A | Discharge status: Home / Self Care | Payer: Self-pay | Source: Home / Self Care | Attending: Certified Nurse Midwife

## 2023-07-27 ENCOUNTER — Inpatient Hospital Stay: Payer: 59 | Admitting: Anesthesiology

## 2023-07-27 ENCOUNTER — Inpatient Hospital Stay: Payer: 59

## 2023-07-27 ENCOUNTER — Other Ambulatory Visit: Payer: Self-pay

## 2023-07-27 ENCOUNTER — Encounter: Payer: Self-pay | Admitting: Obstetrics and Gynecology

## 2023-07-27 DIAGNOSIS — O021 Missed abortion: Secondary | ICD-10-CM | POA: Diagnosis not present

## 2023-07-27 HISTORY — PX: DILATION AND EVACUATION: SHX1459

## 2023-07-27 LAB — CBC WITH DIFFERENTIAL/PLATELET
Abs Immature Granulocytes: 0.02 10*3/uL (ref 0.00–0.07)
Abs Immature Granulocytes: 0.01 10*3/uL (ref 0.00–0.07)
Basophils Absolute: 0.1 10*3/uL (ref 0.0–0.1)
Basophils Absolute: 0.1 10*3/uL (ref 0.0–0.1)
Basophils Relative: 1 %
Basophils Relative: 1 %
Eosinophils Absolute: 0.2 10*3/uL (ref 0.0–0.5)
Eosinophils Absolute: 0.2 10*3/uL (ref 0.0–0.5)
Eosinophils Relative: 3 %
Eosinophils Relative: 4 %
HCT: 31.7 % — ABNORMAL LOW (ref 36.0–46.0)
HCT: 30.3 % — ABNORMAL LOW (ref 36.0–46.0)
Hemoglobin: 11 g/dL — ABNORMAL LOW (ref 12.0–15.0)
Hemoglobin: 10.5 g/dL — ABNORMAL LOW (ref 12.0–15.0)
Immature Granulocytes: 0 %
Immature Granulocytes: 0 %
Lymphocytes Relative: 36 %
Lymphocytes Relative: 36 %
Lymphs Abs: 2.6 10*3/uL (ref 0.7–4.0)
Lymphs Abs: 2.2 10*3/uL (ref 0.7–4.0)
MCH: 28.9 pg (ref 26.0–34.0)
MCH: 28.9 pg (ref 26.0–34.0)
MCHC: 34.7 g/dL (ref 30.0–36.0)
MCHC: 34.7 g/dL (ref 30.0–36.0)
MCV: 83.4 fL (ref 80.0–100.0)
MCV: 83.5 fL (ref 80.0–100.0)
Monocytes Absolute: 0.4 10*3/uL (ref 0.1–1.0)
Monocytes Absolute: 0.4 10*3/uL (ref 0.1–1.0)
Monocytes Relative: 6 %
Monocytes Relative: 6 %
Neutro Abs: 4 10*3/uL (ref 1.7–7.7)
Neutro Abs: 3.2 10*3/uL (ref 1.7–7.7)
Neutrophils Relative %: 54 %
Neutrophils Relative %: 53 %
Platelets: 287 10*3/uL (ref 150–400)
Platelets: 273 10*3/uL (ref 150–400)
RBC: 3.8 MIL/uL — ABNORMAL LOW (ref 3.87–5.11)
RBC: 3.63 MIL/uL — ABNORMAL LOW (ref 3.87–5.11)
RDW: 13 % (ref 11.5–15.5)
RDW: 13 % (ref 11.5–15.5)
WBC: 7.4 10*3/uL (ref 4.0–10.5)
WBC: 6.1 10*3/uL (ref 4.0–10.5)
nRBC: 0 % (ref 0.0–0.2)
nRBC: 0 % (ref 0.0–0.2)

## 2023-07-27 LAB — TYPE AND SCREEN
ABO/RH(D): B POS
Antibody Screen: NEGATIVE

## 2023-07-27 LAB — GLUCOSE, CAPILLARY
Glucose-Capillary: 78 mg/dL (ref 70–99)
Glucose-Capillary: 84 mg/dL (ref 70–99)
Glucose-Capillary: 92 mg/dL (ref 70–99)

## 2023-07-27 SURGERY — DILATION AND EVACUATION, UTERUS
Anesthesia: General | Site: Vagina

## 2023-07-27 MED ORDER — SUGAMMADEX SODIUM 200 MG/2ML IV SOLN
INTRAVENOUS | Status: DC | PRN
  Administered 2023-07-27: 200 mg via INTRAVENOUS

## 2023-07-27 MED ORDER — TRANEXAMIC ACID 1000 MG/10ML IV SOLN
INTRAVENOUS | Status: DC | PRN
  Administered 2023-07-27: 1000 mg via INTRAVENOUS

## 2023-07-27 MED ORDER — 0.9 % SODIUM CHLORIDE (POUR BTL) OPTIME
TOPICAL | Status: DC | PRN
  Administered 2023-07-27: 500 mL

## 2023-07-27 MED ORDER — VASOPRESSIN 20 UNIT/ML IV SOLN
INTRAVENOUS | Status: AC
  Filled 2023-07-27: qty 1

## 2023-07-27 MED ORDER — FENTANYL CITRATE PF 50 MCG/ML IJ SOSY
50.0000 ug | PREFILLED_SYRINGE | Freq: Once | INTRAMUSCULAR | Status: AC
  Administered 2023-07-27: 50 ug via INTRAVENOUS

## 2023-07-27 MED ORDER — DEXAMETHASONE SODIUM PHOSPHATE 10 MG/ML IJ SOLN
INTRAMUSCULAR | Status: DC | PRN
  Administered 2023-07-27: 10 mg via INTRAVENOUS

## 2023-07-27 MED ORDER — ONDANSETRON HCL 4 MG/2ML IJ SOLN: 4.0000 mg | Freq: Once | INTRAMUSCULAR | Status: DC | PRN

## 2023-07-27 MED ORDER — FENTANYL CITRATE PF 50 MCG/ML IJ SOSY
PREFILLED_SYRINGE | INTRAMUSCULAR | Status: AC
  Filled 2023-07-27: qty 1

## 2023-07-27 MED ORDER — MIDAZOLAM HCL 2 MG/2ML IJ SOLN
INTRAMUSCULAR | Status: DC | PRN
  Administered 2023-07-27: 2 mg via INTRAVENOUS

## 2023-07-27 MED ORDER — LACTATED RINGERS IV SOLN: INTRAVENOUS | Status: DC

## 2023-07-27 MED ORDER — FENTANYL CITRATE (PF) 100 MCG/2ML IJ SOLN
INTRAMUSCULAR | Status: DC | PRN
  Administered 2023-07-27 (×2): 50 ug via INTRAVENOUS

## 2023-07-27 MED ORDER — DOXYCYCLINE HYCLATE 100 MG PO TABS
100.0000 mg | ORAL_TABLET | Freq: Once | ORAL | Status: AC
  Administered 2023-07-27: 100 mg via ORAL
  Filled 2023-07-27: qty 1

## 2023-07-27 MED ORDER — TRANEXAMIC ACID-NACL 1000-0.7 MG/100ML-% IV SOLN
INTRAVENOUS | Status: AC
  Filled 2023-07-27: qty 100

## 2023-07-27 MED ORDER — ACETAMINOPHEN 500 MG PO TABS
1000.0000 mg | ORAL_TABLET | ORAL | Status: AC
  Administered 2023-07-27: 1000 mg via ORAL
  Filled 2023-07-27: qty 2

## 2023-07-27 MED ORDER — PROPOFOL 10 MG/ML IV BOLUS
INTRAVENOUS | Status: DC | PRN
  Administered 2023-07-27: 200 mg via INTRAVENOUS

## 2023-07-27 MED ORDER — FENTANYL CITRATE (PF) 100 MCG/2ML IJ SOLN
INTRAMUSCULAR | Status: AC
  Filled 2023-07-27: qty 2

## 2023-07-27 MED ORDER — ROCURONIUM BROMIDE 100 MG/10ML IV SOLN
INTRAVENOUS | Status: DC | PRN
  Administered 2023-07-27: 40 mg via INTRAVENOUS

## 2023-07-27 MED ORDER — POVIDONE-IODINE 10 % EX SWAB: 2.0000 | Freq: Once | CUTANEOUS | Status: DC

## 2023-07-27 MED ORDER — SUCCINYLCHOLINE CHLORIDE 200 MG/10ML IV SOSY
PREFILLED_SYRINGE | INTRAVENOUS | Status: DC | PRN
  Administered 2023-07-27: 100 mg via INTRAVENOUS

## 2023-07-27 MED ORDER — DEXMEDETOMIDINE HCL IN NACL 200 MCG/50ML IV SOLN
INTRAVENOUS | Status: DC | PRN
Start: 2023-07-27 — End: 2023-07-27
  Administered 2023-07-27: 12 ug via INTRAVENOUS
  Administered 2023-07-27: 8 ug via INTRAVENOUS

## 2023-07-27 MED ORDER — SILVER NITRATE-POT NITRATE 75-25 % EX MISC
CUTANEOUS | Status: AC
  Filled 2023-07-27: qty 20

## 2023-07-27 MED ORDER — KETOROLAC TROMETHAMINE 30 MG/ML IJ SOLN: INTRAMUSCULAR | Status: DC | PRN

## 2023-07-27 MED ORDER — DOXYCYCLINE HYCLATE 100 MG PO TABS
200.0000 mg | ORAL_TABLET | Freq: Once | ORAL | Status: AC
  Administered 2023-07-27: 200 mg via ORAL
  Filled 2023-07-27 (×2): qty 2

## 2023-07-27 MED ORDER — CELECOXIB 200 MG PO CAPS
ORAL_CAPSULE | ORAL | Status: AC
  Filled 2023-07-27: qty 2

## 2023-07-27 MED ORDER — CELECOXIB 200 MG PO CAPS
400.0000 mg | ORAL_CAPSULE | ORAL | Status: AC
  Administered 2023-07-27: 400 mg via ORAL

## 2023-07-27 MED ORDER — MIDAZOLAM HCL 2 MG/2ML IJ SOLN
INTRAMUSCULAR | Status: AC
  Filled 2023-07-27: qty 2

## 2023-07-27 MED ORDER — LIDOCAINE HCL (CARDIAC) PF 100 MG/5ML IV SOSY
PREFILLED_SYRINGE | INTRAVENOUS | Status: DC | PRN
  Administered 2023-07-27: 100 mg via INTRAVENOUS

## 2023-07-27 MED ORDER — SODIUM CHLORIDE (PF) 0.9 % IJ SOLN
INTRAMUSCULAR | Status: AC
  Filled 2023-07-27: qty 20

## 2023-07-27 MED ORDER — ONDANSETRON HCL 4 MG/2ML IJ SOLN
INTRAMUSCULAR | Status: DC | PRN
Start: 2023-07-27 — End: 2023-07-27
  Administered 2023-07-27: 4 mg via INTRAVENOUS

## 2023-07-27 MED ORDER — FENTANYL CITRATE (PF) 100 MCG/2ML IJ SOLN: 25.0000 ug | INTRAMUSCULAR | Status: DC | PRN

## 2023-07-27 SURGICAL SUPPLY — 31 items
DRSG TELFA 3X8 NADH STRL (GAUZE/BANDAGES/DRESSINGS) IMPLANT
FILTER UTR ASPR SPEC (MISCELLANEOUS) ×1 IMPLANT
FLTR UTR ASPR SPEC (MISCELLANEOUS) ×1
GAUZE 4X4 16PLY ~~LOC~~+RFID DBL (SPONGE) IMPLANT
GLOVE BIO SURGEON STRL SZ7 (GLOVE) ×1 IMPLANT
GLOVE INDICATOR 7.5 STRL GRN (GLOVE) ×1 IMPLANT
GOWN STRL REUS W/ TWL LRG LVL3 (GOWN DISPOSABLE) ×2 IMPLANT
GOWN STRL REUS W/TWL LRG LVL3 (GOWN DISPOSABLE) ×2
HANDLE YANKAUER SUCT BULB TIP (MISCELLANEOUS) IMPLANT
KIT BERKELEY 1ST TRIMESTER 3/8 (MISCELLANEOUS) ×1 IMPLANT
KIT TURNOVER CYSTO (KITS) ×1 IMPLANT
MANIFOLD NEPTUNE II (INSTRUMENTS) ×1 IMPLANT
NDL HYPO 22X1.5 SAFETY MO (MISCELLANEOUS) IMPLANT
NEEDLE HYPO 22X1.5 SAFETY MO (MISCELLANEOUS) IMPLANT
PACK DNC HYST (MISCELLANEOUS) ×1 IMPLANT
PAD OB MATERNITY 4.3X12.25 (PERSONAL CARE ITEMS) ×1 IMPLANT
PAD PREP OB/GYN DISP 24X41 (PERSONAL CARE ITEMS) ×1 IMPLANT
SCRUB CHG 4% DYNA-HEX 4OZ (MISCELLANEOUS) ×1 IMPLANT
SET BERKELEY SUCTION TUBING (SUCTIONS) ×1 IMPLANT
SET CYSTO W/LG BORE CLAMP LF (SET/KITS/TRAYS/PACK) IMPLANT
SOL PREP PVP 2OZ (MISCELLANEOUS) ×1
SOLUTION PREP PVP 2OZ (MISCELLANEOUS) ×1 IMPLANT
SYR 10ML LL (SYRINGE) IMPLANT
TOWEL OR 17X26 4PK STRL BLUE (TOWEL DISPOSABLE) ×1 IMPLANT
TRAP FLUID SMOKE EVACUATOR (MISCELLANEOUS) ×1 IMPLANT
VACURETTE 10 RIGID CVD (CANNULA) IMPLANT
VACURETTE 6 ASPIR F TIP BERK (CANNULA) IMPLANT
VACURETTE 7MM F TIP STRL (CANNULA) IMPLANT
VACURETTE 8 RIGID CVD (CANNULA) IMPLANT
VACURETTE 8MM F TIP (MISCELLANEOUS) ×1 IMPLANT
WATER STERILE IRR 500ML POUR (IV SOLUTION) ×1 IMPLANT

## 2023-07-27 NOTE — Discharge Instructions (Signed)
Discharge instructions after a Dilation and Curettage ° °Signs and Symptoms to Report ° °Call our office at [phone] if you have any of the following:  ° °• Fever over 100.4 degrees or higher °• Severe stomach pain not relieved with pain medications °• Bright red bleeding that’s heavier than a period that does not slow with rest after the first 24 hours °• To go the bathroom a lot (frequency), you can’t hold your urine (urgency), or it hurts when you empty your bladder (urinate) °• Chest pain °• Shortness of breath °• Pain in the calves of your legs °• Severe nausea and vomiting not relieved with anti-nausea medications °• Any concerns ° °What You Can Expect after Surgery °• You may see some pink tinged, bloody fluid. This is normal. You may also have cramping for several days.  ° °Activities after Your Discharge °Follow these guidelines to help speed your recovery at home: °• Don’t drive if you are in pain or taking narcotic pain medicine. You may drive when you can safely slam on the brakes, turn the wheel forcefully, and rotate your torso comfortably. This is typically 4-7 days. Practice in a parking lot or side street prior to attempting to drive regularly.  °• Ask others to help with household chores until you feel up to doing tasks. °• Don’t do strenuous activities, exercises, or sports like vacuuming, tennis, squash, etc. until your doctor says it is safe to do so. °• Walk as you feel able. Rest often since it may take a week or two for your energy level to return to normal.  °• You may climb stairs °• Avoid constipation: °  -Eat fruits, vegetables, and whole grains. Eat small meals as your appetite will take time to return to normal. °  -Drink 6 to 8 glasses of water each day unless your doctor has told you to limit your fluids. °  -Use a laxative or stool softener as needed if constipation becomes a problem. You may take Miralax, metamucil, Citrucil, Colace, Senekot, FiberCon, etc. If this does not  relieve the constipation, try two tablespoons of Milk Of Magnesia every 8 hours until your bowels move.  °• You may shower.  °• Do not get in a hot tub, swimming pool, etc. until your doctor agrees. °• Do not douche, use tampons, or have sex until you stop spotting, usually about 2 weeks. °• Take your pain medicine when you need it. The medicine may not work as well if the pain is bad. ° °Take the medicines you were taking before surgery. Other medications you might need are pain medications (ibuprofen), medications for constipation (Colace) and nausea medications (Zofran).  ° ° ° ° ° °Coping with Pregnancy Loss °Pregnancy loss can happen any time during a pregnancy. Often the cause is not known. It is rarely because of anything you did. Pregnancy loss in early pregnancy (during the first trimester) is called a miscarriage. This type of pregnancy loss is the most common.  ° °Any pregnancy loss can be devastating. You will need to recover both physically and emotionally. Most women are able to get pregnant again after a pregnancy loss and deliver a healthy baby. ° °How to manage emotional recovery ° °Pregnancy loss is very hard emotionally. You may feel many different emotions while you grieve. You may feel sad and angry. You may also feel guilty. It is normal to have periods of crying. Emotional recovery can take longer than physical recovery. It is different for everyone.   Some women may feel back to normal quickly and others take longer. °Taking these steps can help you cope: °· Remember that it is unlikely you did anything to cause the pregnancy loss. °· Share your thoughts and feelings with friends, family, and your partner. Remember that your partner is also recovering emotionally. °· Make sure you have a good support system, and do not spend too much time alone. °· Meet with a pregnancy loss counselor or join a pregnancy loss support group. °· Get enough sleep and eat a healthy diet. Return to regular exercise  when you have recovered physically. °· Do not use drugs or alcohol to manage your emotions. °· Consider seeing a mental health professional to help you recover emotionally. °· Ask a friend or loved one to help you decide what to do with any clothing and nursery items you received for your baby. ° °How to recognize emotional stress °It is normal to have emotional stress after a pregnancy loss. But emotional stress that lasts a long time or becomes severe requires treatment. Watch out for these signs of severe emotional stress: °· Sadness, anger, or guilt that is not going away and is interfering with your normal activities. °· Relationship problems that have occurred or gotten worse since the pregnancy loss. °· Signs of depression that last longer than 2 weeks. These may include: °? Sadness. °? Anxiety. °? Hopelessness. °? Loss of interest in activities you enjoy. °? Inability to concentrate. °? Trouble sleeping or sleeping too much. °? Loss of appetite or overeating. °? Thoughts of death or of hurting yourself. °Follow these instructions at home: °Medicines °· Take over-the-counter and prescription medicines only as told by your health care provider. °Activity °· Rest at home until your energy level returns. Return to your normal activities as told by your health care provider. Ask your health care provider what activities are safe for you. °General instructions °· Keep all follow-up visits as told by your health care provider. This is important. °· It may be helpful to meet with others who have experienced pregnancy loss. Ask your health care provider about support groups and resources. °· To help you and your partner with the process of grieving, talk with your health care provider or seek counseling. °· When you are ready, meet with your health care provider to discuss steps to take for a future pregnancy. °Where to find more information °· [place] Department of Health and Human Services Office on Women's Health:  www.womenshealth.gov °· American Pregnancy Association: www.americanpregnancy.org °Contact a health care provider if: °· You continue to experience grief, sadness, or lack of motivation for everyday activities, and those feelings do not improve over time. °· You are struggling to recover emotionally, especially if you are using alcohol or substances to help. °Get help right away if: °· You have thoughts of hurting yourself or others. °If you ever feel like you may hurt yourself or others, or have thoughts about taking your own life, get help right away. You can go to your nearest emergency department or call: °· Your local emergency services (911 in the [place] °· A suicide crisis helpline, such as the National Suicide Prevention Lifeline at 1-800-273-8255. This is open 24 hours a day. °Summary °· Any pregnancy loss can be difficult physically and emotionally. °· You may experience many different emotions while you grieve. Emotional recovery can last longer than physical recovery. °· It is normal to have emotional stress after a pregnancy loss. But emotional stress that lasts a   long time or becomes severe requires treatment. °· See your health care provider if you are struggling emotionally after a pregnancy loss. °This information is not intended to replace advice given to you by your health care provider. Make sure you discuss any questions you have with your health care provider. °Document Released: 12/20/2017 Document Revised: 12/20/2017 Document Reviewed: 12/20/2017 °Elsevier Interactive Patient Education © 2019 Elsevier Inc. ° ° °

## 2023-07-27 NOTE — Progress Notes (Signed)
Consult History and Physical   SERVICE: Gynecology   Patient Name: [name] Patient MRN:   [id number]  CC: Missed AB  HPI: [name] is a 32 y.o. [id number] with bleeding during expectant management of a missed AB.   Review of Systems: positives in bold GEN:   fevers, chills, weight changes, appetite changes, fatigue, night sweats HEENT:  HA, vision changes, hearing loss, congestion, rhinorrhea, sinus pressure, dysphagia CV:   CP, palpitations PULM:  SOB, cough GI:  abd pain, N/V/D/C GU:  Heavy bleeding and cramping, dysuria, urgency, frequency MSK:  arthralgias, myalgias, back pain, swelling SKIN:  rashes, color changes, pallor NEURO:  numbness, weakness, tingling, seizures, dizziness, tremors PSYCH:  depression, anxiety, behavioral problems, confusion  HEME/LYMPH:  easy bruising or bleeding ENDO:  heat/cold intolerance  Past Obstetrical History: OB History     Gravida  3   Para  2   Term  2   Preterm  0   AB  0   Living  2      SAB  0   IAB  0   Ectopic  0   Multiple  0   Live Births  2           Past Gynecologic History: Patient's last menstrual period was 05/18/2023 (exact date).   Past Medical History: Past Medical History:  Diagnosis Date   Diabetes mellitus without complication (HCC)    History of kidney stones    Kidney stone 2012   Menorrhagia    Polycystic ovarian disease    PONV (postoperative nausea and vomiting)    vomited x2 in OR during previous C-section   UTI (lower urinary tract infection)     Past Surgical History:   Past Surgical History:  Procedure Laterality Date   CESAREAN SECTION N/A 01/03/2017   Procedure: CESAREAN SECTION;  Surgeon: Conard Novak, MD;  Location: [facility] ORS;  Service: Obstetrics;  Laterality: N/A;   CESAREAN SECTION N/A 07/08/2021   Procedure: REPEAT CESAREAN SECTION;  Surgeon: Feliberto Gottron, Ihor Austin, MD;  Location: [facility] ORS;  Service: Obstetrics;  Laterality: N/A;   NO PAST SURGERIES      WISDOM TOOTH EXTRACTION      Family History:  family history includes Arthritis in her paternal grandmother; Diabetes in her mother and paternal grandmother; Diabetes type II in her paternal grandfather; Heart disease in her maternal grandmother; Hypertension in her paternal grandmother; Thyroid cancer in her maternal grandmother.  Social History:  Social History   Socioeconomic History   Marital status: Married    Spouse name: [name]   Number of children: Not on file   Years of education: Not on file   Highest education level: Not on file  Occupational History   Occupation: Airline pilot: [employer]    Comment: mgr  Tobacco Use   Smoking status: Never   Smokeless tobacco: Never  Vaping Use   Vaping status: Never Used  Substance and Sexual Activity   Alcohol use: No   Drug use: No   Sexual activity: Yes  Other Topics Concern   Not on file  Social History Narrative   ** Merged History Encounter **       [employer] billing specialist   Lives with step dad.   Social Determinants of Health   Financial Resource Strain: Low Risk  (10/25/2022)   [facility]   Overall Financial Resource Strain (CARDIA)    Difficulty of Paying Living Expenses: Not very  hard  Food Insecurity: No Food Insecurity (07/27/2023)   Hunger Vital Sign    Worried About Running Out of Food in the Last Year: Never true    Ran Out of Food in the Last Year: Never true  Transportation Needs: No Transportation Needs (07/27/2023)   PRAPARE - Administrator, Civil Service (Medical): No    Lack of Transportation (Non-Medical): No  Physical Activity: Not on file  Stress: Not on file  Social Connections: Not on file  Intimate Partner Violence: Not At Risk (07/27/2023)   Humiliation, Afraid, Rape, and Kick questionnaire    Fear of Current or Ex-Partner: No    Emotionally Abused: No    Physically Abused: No    Sexually Abused: No    Home Medications:   Medications reconciled in EPIC  No current facility-administered medications on file prior to encounter.   Current Outpatient Medications on File Prior to Encounter  Medication Sig Dispense Refill   acetaminophen (TYLENOL) 500 MG tablet Take 2 tablets (1,000 mg total) by mouth every 6 (six) hours. 30 tablet 0   calcium carbonate (TUMS - DOSED IN MG ELEMENTAL CALCIUM) 500 MG chewable tablet Chew 1 tablet by mouth daily as needed for indigestion or heartburn.     cetirizine (ZYRTEC) 10 MG tablet Take 10 mg by mouth daily.     fluticasone (FLONASE) 50 MCG/ACT nasal spray Place 2 sprays into the nose daily as needed.     glipiZIDE (GLUCOTROL) 5 MG tablet Take 0.5 tablets (2.5 mg total) by mouth at bedtime. 15 tablet 11   ibuprofen (ADVIL) 600 MG tablet Take 1 tablet (600 mg total) by mouth every 6 (six) hours.     metFORMIN (GLUCOPHAGE) 1000 MG tablet Take 1 tablet (1,000 mg total) by mouth 2 (two) times daily with a meal. 60 tablet 11   Prenatal Vit-Fe Fumarate-FA (MULTIVITAMIN-PRENATAL) 27-0.8 MG TABS tablet Take 1 tablet by mouth daily.     senna-docusate (SENOKOT-S) 8.6-50 MG tablet Take 2 tablets by mouth daily.     simethicone (MYLICON) 80 MG chewable tablet Chew 1 tablet (80 mg total) by mouth 3 (three) times daily after meals. 30 tablet 0    Allergies:  No Known Allergies  Physical Exam:  Temp:  [97.7 F (36.5 C)-98.2 F (36.8 C)] 97.7 F (36.5 C) (10/04 0804) Pulse Rate:  [75-98] 87 (10/04 0804) Resp:  [17-20] 18 (10/04 0804) BP: (91-136)/(50-88) 100/55 (10/04 0804) SpO2:  [98 %-100 %] 98 % (10/04 0804) Weight:  [86.2 kg] 86.2 kg (10/03 1833)   General Appearance:  Well developed, well nourished, no acute distress, alert and oriented x3 HEENT:  Normocephalic  Cardiovascular:  Regular rate and rhythm Pulmonary: Good air exchange Abdomen:  Soft, nondistended Extremities:  Full range of motion Skin:  normal coloration   Neurologic:  Grossly intact Psychiatric:  Normal mood  and affect, appropriate  Labs/Studies:   CBC and Coags:  Lab Results  Component Value Date   WBC 7.4 07/27/2023   NEUTOPHILPCT 54 07/27/2023   EOSPCT 3 07/27/2023   BASOPCT 1 07/27/2023   LYMPHOPCT 36 07/27/2023   HGB 11.0 (L) 07/27/2023   HCT 31.7 (L) 07/27/2023   MCV 83.4 07/27/2023   PLT 287 07/27/2023   CMP:  Lab Results  Component Value Date   NA 137 07/26/2023   K 4.5 07/26/2023   CL 103 07/26/2023   CO2 23 07/26/2023   BUN 8 07/26/2023   CREATININE 0.55 07/26/2023   CREATININE 0.40 (L)  07/08/2021   CREATININE 0.69 12/15/2013   PROT 6.8 12/15/2013   BILITOT <0.2 12/15/2013   ALT 19 12/15/2013   AST 16 12/15/2013   ALKPHOS 60 12/15/2013    Other Imaging: US OB LESS THAN 14 WEEKS WITH OB TRANSVAGINAL  Result Date: 07/26/2023 CLINICAL DATA:  Bleeding, recent miscarriage EXAM: OBSTETRIC <14 WK [place] AND TRANSVAGINAL OB US TECHNIQUE: Both transabdominal and transvaginal ultrasound examinations were performed for complete evaluation of the gestation as well as the maternal uterus, adnexal regions, and pelvic cul-de-sac. Transvaginal technique was performed to assess early pregnancy. COMPARISON:  07/22/2023 FINDINGS: Intrauterine gestational sac: None Yolk sac:  Not Visualized. Embryo:  Not Visualized. Cardiac Activity: Not Visualized. Heart Rate:   bpm MSD:   mm    w     d CRL:    mm    w    d                  [place] EDC: Subchorionic hemorrhage:  None visualized. Maternal uterus/adnexae: Endometrium is thickened at 20 mm, complex and heterogeneous. Internal blood flow. Findings compatible with retained products of conception. No adnexal mass or free fluid. IMPRESSION: Thickened, heterogeneous, hypervascular endometrium concerning for retained products of conception. Electronically Signed   By: Charlett Nose M.D.   On: 07/26/2023 21:30   US OB LESS THAN 14 WEEKS WITH OB TRANSVAGINAL  Result Date: 07/22/2023 CLINICAL DATA:  Vaginal bleeding. Early pregnancy. Estimated gestational age  outside first-trimester ultrasound equal 7 weeks 5 days. EXAM: OBSTETRIC <14 WK [place] AND TRANSVAGINAL OB US TECHNIQUE: Both transabdominal and transvaginal ultrasound examinations were performed for complete evaluation of the gestation as well as the maternal uterus, adnexal regions, and pelvic cul-de-sac. Transvaginal technique was performed to assess early pregnancy. COMPARISON:  None Available. FINDINGS: Intrauterine gestational sac: Present Yolk sac:  Present (8 mm) Embryo:  Present Cardiac Activity: Not identified CRL:  6.8 mm   6 w   4 d                  [place] EDC: 03/12/2024 Subchorionic hemorrhage:  None visualized. Maternal uterus/adnexae: Corpus luteal cyst of the RIGHT ovary. LEFT ovary normal. No free fluid. IMPRESSION: 1. Single intrauterine gestation with embryo and enlarged yolk sac. No fetal heart rate demonstrated. Finding suspicious but not definitive for non-viability. 2. Corpus luteal cyst of the RIGHT ovary. 3. No free fluid Electronically Signed   By: Genevive Bi M.D.   On: 07/22/2023 12:51     Assessment / Plan:   [name] is a 32 y.o. [id number] who presents with heavy bleeding  1. Dr. Dalbert Garnet updated 2. Plan to go to OR for D&C   [name] 07/27/2023 9:14 AM

## 2023-07-27 NOTE — Anesthesia Procedure Notes (Signed)
Procedure Name: Intubation Date/Time: 07/27/2023 12:30 PM  Performed by: Mohammed Kindle, CRNAPre-anesthesia Checklist: Patient identified, Emergency Drugs available, Suction available and Patient being monitored Patient Re-evaluated:Patient Re-evaluated prior to induction Oxygen Delivery Method: Circle system utilized Preoxygenation: Pre-oxygenation with 100% oxygen Induction Type: IV induction Ventilation: Mask ventilation without difficulty Laryngoscope Size: McGraph and 3 Grade View: Grade I Tube type: Oral Tube size: 6.5 mm Number of attempts: 1 Airway Equipment and Method: Stylet Placement Confirmation: ETT inserted through vocal cords under direct vision, positive ETCO2, breath sounds checked- equal and bilateral and CO2 detector Secured at: 21 cm Tube secured with: Tape Dental Injury: Teeth and Oropharynx as per pre-operative assessment

## 2023-07-27 NOTE — Progress Notes (Signed)
Patient discharged home with family.  Discharge instructions, when to follow up, and prescriptions reviewed with patient.  Patient verbalized understanding. Patient will be escorted out by auxiliary.   

## 2023-07-27 NOTE — Op Note (Signed)
Operative Report Suction Dilation and Curettage   Indications: Heavy vaginal bleeding  Pre-operative Diagnosis: Missed abortion at 11 weeks  Post-operative Diagnosis: same.  Procedure: 1. Suction D&C  Surgeon: Christeen Douglas, MD  Assistant(s):  None  Anesthesia: General LMA anesthesia  Anesthesiologist: Darleene Cleaver, Gerrit Heck, MD Anesthesiologist: Darleene Cleaver, Gerrit Heck, MD CRNA: Mohammed Kindle, CRNA  Estimated Blood Loss:  200 mL         Intraoperative medications:  TXA         Total IV Fluids:  Urine Output:         Specimens: Products of conception         Complications:  None; patient tolerated the procedure well.         Disposition: PACU - hemodynamically stable.         Condition: stable  Findings: Uterus measuring 9 weeks; normal cervix, vagina, perineum.   Indication for procedure/Consents: 32 y.o. [id number] here for scheduled surgery for the aforementioned diagnoses.     Risks of surgery were discussed with the patient including but not limited to: bleeding which may require transfusion; infection which may require antibiotics; injury to uterus or surrounding organs; intrauterine scarring which may impair future fertility; need for additional procedures including laparotomy or laparoscopy; and other postoperative/anesthesia complications. Written informed consent was obtained.    Procedure Details:   The patient received oral antibiotics while in the preoperative area.  She was then taken to the operating room where general anesthesia was administered and was found to be adequate.  After a formal and adequate timeout was performed, she was placed in the dorsal lithotomy position and examined with the above findings. She was then prepped and draped in the sterile manner.   Her bladder was catheterized for an estimated amount of clear, yellow urine. A speculum was then placed in the patient's vagina and a single tooth tenaculum was  applied to the anterior lip of the cervix.    No uterine sounding was performed on this pregnant uterus. Her cervix was serially dilated to accommodate a 8 sized rigid suction curette.  A curettage was then performed until there was a gritty texture in all four quadrants.  The tenaculum was removed from the anterior lip of the cervix and the vaginal speculum was removed after noting good hemostasis. The patient tolerated the procedure well and was taken to the recovery area awake, extubated and in stable condition.  The patient will be discharged to home as per PACU criteria.  She will receive another dose of oral antibiotics prior to discharge. Routine postoperative instructions given.  She was prescribed Percocet, Ibuprofen and Colace.  She will follow up in the clinic in two weeks for postoperative evaluation.

## 2023-07-27 NOTE — Anesthesia Preprocedure Evaluation (Signed)
Anesthesia Evaluation  Patient identified by MRN, date of birth, ID band Patient awake    Reviewed: Allergy & Precautions, NPO status , Patient's Chart, lab work & pertinent test results  History of Anesthesia Complications (+) PONV and history of anesthetic complications  Airway Mallampati: III  TM Distance: >3 FB Neck ROM: Full    Dental  (+) Teeth Intact   Pulmonary neg pulmonary ROS   Pulmonary exam normal breath sounds clear to auscultation       Cardiovascular Exercise Tolerance: Good negative cardio ROS Normal cardiovascular exam Rhythm:Regular Rate:Normal     Neuro/Psych negative neurological ROS  negative psych ROS   GI/Hepatic negative GI ROS, Neg liver ROS,,,  Endo/Other  negative endocrine ROSdiabetes, Type 2, Oral Hypoglycemic Agents  Morbid obesity  Renal/GU   negative genitourinary   Musculoskeletal negative musculoskeletal ROS (+)    Abdominal  (+) + obese  Peds negative pediatric ROS (+)  Hematology negative hematology ROS (+)   Anesthesia Other Findings Past Medical History: No date: Diabetes mellitus without complication (HCC) No date: History of kidney stones 2012: Kidney stone No date: Menorrhagia No date: Polycystic ovarian disease No date: PONV (postoperative nausea and vomiting)     Comment:  vomited x2 in OR during previous C-section No date: UTI (lower urinary tract infection)  Past Surgical History: 01/03/2017: CESAREAN SECTION; N/A     Comment:  Procedure: CESAREAN SECTION;  Surgeon: Conard Novak, MD;  Location: [facility] ORS;  Service: Obstetrics;                Laterality: N/A; 07/08/2021: CESAREAN SECTION; N/A     Comment:  Procedure: REPEAT CESAREAN SECTION;  Surgeon:               Feliberto Gottron, Ihor Austin, MD;  Location: [facility] ORS;                Service: Obstetrics;  Laterality: N/A; No date: NO PAST SURGERIES No date: WISDOM TOOTH EXTRACTION  BMI    Body  Mass Index: 32.62 kg/m      Reproductive/Obstetrics negative OB ROS                             Anesthesia Physical Anesthesia Plan  ASA: 2  Anesthesia Plan: General   Post-op Pain Management:    Induction: Intravenous  PONV Risk Score and Plan: 1 and Ondansetron and Dexamethasone  Airway Management Planned: LMA  Additional Equipment:   Intra-op Plan:   Post-operative Plan: Extubation in OR  Informed Consent: I have reviewed the patients History and Physical, chart, labs and discussed the procedure including the risks, benefits and alternatives for the proposed anesthesia with the patient or authorized representative who has indicated his/her understanding and acceptance.     Dental Advisory Given  Plan Discussed with: CRNA and Surgeon  Anesthesia Plan Comments:        Anesthesia Quick Evaluation

## 2023-07-27 NOTE — Transfer of Care (Signed)
Immediate Anesthesia Transfer of Care Note  Patient: [name]  Procedure(s) Performed: Suction DILATATION AND CURETTAGE (Vagina )  Patient Location: PACU  Anesthesia Type:General  Level of Consciousness: awake, drowsy, and patient cooperative  Airway & Oxygen Therapy: Patient Spontanous Breathing and Patient connected to face mask oxygen  Post-op Assessment: Report given to RN and Post -op Vital signs reviewed and stable  Post vital signs: Reviewed and stable  Last Vitals:  Vitals Value Taken Time  BP    Temp    Pulse 90 07/27/23 1317  Resp 17 07/27/23 1317  SpO2 93 % 07/27/23 1317  Vitals shown include unfiled device data.  Last Pain:  Vitals:   07/27/23 1314  TempSrc:   PainSc: 0-No pain         Complications: No notable events documented.

## 2023-07-30 ENCOUNTER — Encounter: Payer: Self-pay | Admitting: Obstetrics and Gynecology

## 2023-07-30 NOTE — Anesthesia Postprocedure Evaluation (Signed)
Anesthesia Post Note  Patient: [name]  Procedure(s) Performed: Suction DILATATION AND CURETTAGE (Vagina )  Patient location during evaluation: PACU Anesthesia Type: General Level of consciousness: awake Pain management: pain level controlled Vital Signs Assessment: post-procedure vital signs reviewed and stable Respiratory status: spontaneous breathing Cardiovascular status: stable Anesthetic complications: no   No notable events documented.   Last Vitals:  Vitals:   07/27/23 1345 07/27/23 1412  BP: (!) 96/52 (!) 93/52  Pulse: 72 84  Resp: 20 17  Temp: 36.6 C   SpO2: 96% 99%    Last Pain:  Vitals:   07/27/23 1345  TempSrc:   PainSc: 0-No pain                 VAN STAVEREN,Edison Nicholson

## 2023-07-31 LAB — SURGICAL PATHOLOGY

## 2023-08-06 DIAGNOSIS — Z23 Encounter for immunization: Secondary | ICD-10-CM | POA: Diagnosis not present

## 2023-08-06 DIAGNOSIS — E119 Type 2 diabetes mellitus without complications: Secondary | ICD-10-CM | POA: Diagnosis not present

## 2023-08-06 DIAGNOSIS — Z794 Long term (current) use of insulin: Secondary | ICD-10-CM | POA: Diagnosis not present

## 2023-08-07 NOTE — Discharge Summary (Signed)
Discharged from PACU after successful D&C

## 2023-08-10 DIAGNOSIS — Z09 Encounter for follow-up examination after completed treatment for conditions other than malignant neoplasm: Secondary | ICD-10-CM | POA: Diagnosis not present

## 2023-08-22 DIAGNOSIS — Z1339 Encounter for screening examination for other mental health and behavioral disorders: Secondary | ICD-10-CM | POA: Diagnosis not present

## 2023-08-22 DIAGNOSIS — Z01419 Encounter for gynecological examination (general) (routine) without abnormal findings: Secondary | ICD-10-CM | POA: Diagnosis not present

## 2023-08-22 DIAGNOSIS — Z124 Encounter for screening for malignant neoplasm of cervix: Secondary | ICD-10-CM | POA: Diagnosis not present

## 2023-08-22 DIAGNOSIS — Z1151 Encounter for screening for human papillomavirus (HPV): Secondary | ICD-10-CM | POA: Diagnosis not present

## 2023-08-22 DIAGNOSIS — Z1331 Encounter for screening for depression: Secondary | ICD-10-CM | POA: Diagnosis not present

## 2023-08-27 DIAGNOSIS — O021 Missed abortion: Secondary | ICD-10-CM | POA: Diagnosis not present

## 2024-02-06 ENCOUNTER — Other Ambulatory Visit: Payer: Self-pay

## 2024-02-06 ENCOUNTER — Emergency Department
Admission: EM | Admit: 2024-02-06 | Discharge: 2024-02-06 | Disposition: A | Discharge status: Home / Self Care | Attending: Emergency Medicine | Admitting: Emergency Medicine

## 2024-02-06 ENCOUNTER — Encounter: Payer: Self-pay | Admitting: Emergency Medicine

## 2024-02-06 DIAGNOSIS — R079 Chest pain, unspecified: Secondary | ICD-10-CM

## 2024-02-06 DIAGNOSIS — O99891 Other specified diseases and conditions complicating pregnancy: Secondary | ICD-10-CM | POA: Diagnosis present

## 2024-02-06 DIAGNOSIS — Z3A09 9 weeks gestation of pregnancy: Secondary | ICD-10-CM | POA: Diagnosis not present

## 2024-02-06 DIAGNOSIS — O24811 Other pre-existing diabetes mellitus in pregnancy, first trimester: Secondary | ICD-10-CM | POA: Insufficient documentation

## 2024-02-06 DIAGNOSIS — R0789 Other chest pain: Secondary | ICD-10-CM | POA: Insufficient documentation

## 2024-02-06 DIAGNOSIS — Z3491 Encounter for supervision of normal pregnancy, unspecified, first trimester: Secondary | ICD-10-CM

## 2024-02-06 LAB — CBC
HCT: 36.1 % (ref 36.0–46.0)
Hemoglobin: 12.3 g/dL (ref 12.0–15.0)
MCH: 29 pg (ref 26.0–34.0)
MCHC: 34.1 g/dL (ref 30.0–36.0)
MCV: 85.1 fL (ref 80.0–100.0)
Platelets: 358 10*3/uL (ref 150–400)
RBC: 4.24 MIL/uL (ref 3.87–5.11)
RDW: 13.2 % (ref 11.5–15.5)
WBC: 7.2 10*3/uL (ref 4.0–10.5)
nRBC: 0 % (ref 0.0–0.2)

## 2024-02-06 LAB — BASIC METABOLIC PANEL WITH GFR
Anion gap: 5 (ref 5–15)
BUN: 6 mg/dL (ref 6–20)
CO2: 22 mmol/L (ref 22–32)
Calcium: 8.9 mg/dL (ref 8.9–10.3)
Chloride: 106 mmol/L (ref 98–111)
Creatinine, Ser: 0.45 mg/dL (ref 0.44–1.00)
GFR, Estimated: >60 mL/min (ref >60)
Glucose, Bld: 127 mg/dL — ABNORMAL HIGH (ref 70–99)
Potassium: 3.5 mmol/L (ref 3.5–5.1)
Sodium: 133 mmol/L — ABNORMAL LOW (ref 135–145)

## 2024-02-06 LAB — TROPONIN I (HIGH SENSITIVITY): Troponin I (High Sensitivity): <2 ng/L (ref <18)

## 2024-02-06 LAB — D-DIMER, QUANTITATIVE: D-Dimer, Quant: 0.41 ug{FEU}/mL (ref 0.00–0.50)

## 2024-02-06 NOTE — ED Triage Notes (Signed)
 C/O chest tightness, unable to get full breath.  ONset this morning.  Non productive cough with symptoms.  Denies fever chills.  Denies aggravating/alleviating factors.  AAOx3.  Skin warm and dry. NAD

## 2024-02-06 NOTE — ED Provider Notes (Signed)
 [facility] Provider Note    Event Date/Time   First MD Initiated Contact with Patient 02/06/24 1429     (approximate)   History   Chief Complaint: Chest Pain   HPI  [name] is a 33 y.o. female with past history of diabetes, currently [redacted] weeks pregnant who comes ED complaining of central chest tightness that started this morning, constant, no aggravating or alleviating factors.  Associated with mild shortness of breath.  No particular cough, no fever or recent illness, no trauma.  Nonradiating.  No diaphoresis or vomiting.  No recent hospitalization travel trauma or surgery.        Past Medical History:  Diagnosis Date   Diabetes mellitus without complication (HCC)    History of kidney stones    Kidney stone 2012   Menorrhagia    Polycystic ovarian disease    PONV (postoperative nausea and vomiting)    vomited x2 in OR during previous C-section   UTI (lower urinary tract infection)     Current Outpatient Rx   Order #: [id number] Class: Normal   Order #: [id number] Class: Historical Med   Order #: [id number] Class: Historical Med   Order #: [id number] Class: Historical Med   Order #: [id number] Class: Normal   Order #: [id number] Class: OTC   Order #: [id number] Class: Normal   Order #: [id number] Class: Historical Med   Order #: [id number] Class: OTC   Order #: [id number] Class: OTC    Past Surgical History:  Procedure Laterality Date   CESAREAN SECTION N/A 01/03/2017   Procedure: CESAREAN SECTION;  Surgeon: Conard Novak, MD;  Location: [facility];  Service: Obstetrics;  Laterality: N/A;   CESAREAN SECTION N/A 07/08/2021   Procedure: REPEAT CESAREAN SECTION;  Surgeon: Feliberto Gottron, Ihor Austin, MD;  Location: [facility];  Service: Obstetrics;  Laterality: N/A;   DILATION AND EVACUATION N/A 07/27/2023   Procedure: Suction DILATATION AND CURETTAGE;  Surgeon: Christeen Douglas, MD;  Location: [facility];  Service: Gynecology;  Laterality: N/A;   NO PAST  SURGERIES     WISDOM TOOTH EXTRACTION      Physical Exam   Triage Vital Signs: ED Triage Vitals  Encounter Vitals Group     BP 02/06/24 1402 111/64     Systolic BP Percentile --      Diastolic BP Percentile --      Pulse Rate 02/06/24 1402 92     Resp 02/06/24 1402 16     Temp 02/06/24 1402 98.5 F (36.9 C)     Temp Source 02/06/24 1402 Oral     SpO2 02/06/24 1402 100 %     Weight 02/06/24 1400 200 lb (90.7 kg)     Height 02/06/24 1400 5\' 4"  (1.626 m)     Head Circumference --      Peak Flow --      Pain Score 02/06/24 1400 6     Pain Loc --      Pain Education --      Exclude from Growth Chart --     Most recent vital signs: Vitals:   02/06/24 1402  BP: 111/64  Pulse: 92  Resp: 16  Temp: 98.5 F (36.9 C)  SpO2: 100%    General: Awake, no distress.  CV:  Good peripheral perfusion.  Regular rate and rhythm Resp:  Normal effort.  Clear to auscultation bilaterally Abd:  No distention.  Soft nontender Other:  No lower extremity edema or calf tenderness.  Symmetric calf circumference   ED Results / Procedures /  Treatments   Labs (all labs ordered are listed, but only abnormal results are displayed) Labs Reviewed  BASIC METABOLIC PANEL WITH GFR  CBC  D-DIMER, QUANTITATIVE  TROPONIN I (HIGH SENSITIVITY)     EKG Interpreted by me Normal sinus rhythm rate of 80.  Normal axis intervals QRS ST segments and T waves   RADIOLOGY    PROCEDURES:  Procedures   MEDICATIONS ORDERED IN ED: Medications - No data to display   IMPRESSION / MDM / ASSESSMENT AND PLAN / ED COURSE  I reviewed the triage vital signs and the nursing notes.  DDx: Chest wall pain, pulmonary embolism, electrolyte derangement, GERD, doubt ACS or dissection  Patient's presentation is most consistent with acute presentation with potential threat to life or bodily function.  Patient presents with nonspecific central chest pain, [redacted] weeks pregnant.  Vital signs are normal, exam is normal.   Labs are normal EKG unremarkable, stable for discharge.       FINAL CLINICAL IMPRESSION(S) / ED DIAGNOSES   Final diagnoses:  Nonspecific chest pain  First trimester pregnancy     Rx / DC Orders   ED Discharge Orders     None        Note:  This document was prepared using Dragon voice recognition software and may include unintentional dictation errors.   Jacquie Maudlin, MD 02/06/24 1515

## 2024-02-13 DIAGNOSIS — O99210 Obesity complicating pregnancy, unspecified trimester: Secondary | ICD-10-CM | POA: Insufficient documentation

## 2024-02-13 LAB — OB RESULTS CONSOLE VARICELLA ZOSTER ANTIBODY, IGG: Varicella: IMMUNE

## 2024-02-13 LAB — OB RESULTS CONSOLE RUBELLA ANTIBODY, IGM: Rubella: IMMUNE

## 2024-02-13 LAB — OB RESULTS CONSOLE HEPATITIS B SURFACE ANTIGEN: Hepatitis B Surface Ag: NEGATIVE

## 2024-03-08 ENCOUNTER — Ambulatory Visit
Admission: RE | Admit: 2024-03-08 | Discharge: 2024-03-08 | Disposition: A | Discharge status: Home / Self Care | Source: Ambulatory Visit

## 2024-03-08 VITALS — BP 113/71 | HR 81 | Temp 98.0°F | Resp 18

## 2024-03-08 DIAGNOSIS — M795 Residual foreign body in soft tissue: Secondary | ICD-10-CM

## 2024-03-08 DIAGNOSIS — W57XXXA Bitten or stung by nonvenomous insect and other nonvenomous arthropods, initial encounter: Secondary | ICD-10-CM

## 2024-03-08 DIAGNOSIS — Z3A13 13 weeks gestation of pregnancy: Secondary | ICD-10-CM

## 2024-03-08 DIAGNOSIS — S30860A Insect bite (nonvenomous) of lower back and pelvis, initial encounter: Secondary | ICD-10-CM | POA: Diagnosis not present

## 2024-03-08 NOTE — ED Triage Notes (Signed)
 Patient to Urgent Care with complaints of a tick bite on her back. Reports her husband was able to remove the body but the head is retained in her back. Found the tick yesterday.  Patient reports she has been having migraines all week. Unsure if these are related. Denies any other symptoms.   13w 4d pregnant.

## 2024-03-08 NOTE — Discharge Instructions (Signed)
 Keep your wound clean and dry.  Wash it gently twice a day with soap and water, then apply an antibiotic ointment and bandage.    Please follow-up with your primary care provider on Monday.    Lyme and [facility] spotted fever blood work pending.  See the attached information on tick bites.

## 2024-03-08 NOTE — ED Provider Notes (Signed)
 [name]    CSN: [id number] Arrival date & time: 03/08/24  [id number]      History   Chief Complaint Chief Complaint  Patient presents with   Tick Removal    Tick bite on my back, my husband tried to remove it but a piece is stuck.  I am also [redacted] weeks pregnant and have had a major headache the past few days and my ob is wanting to get the needed test if possible - Entered by patient    HPI [name] is a 33 y.o. female.  Patient is [redacted]w[redacted]d pregnant.  She has a tick bite on her back that she noticed yesterday evening.  Her husband attempted to remove the tick but was unable to remove the head as it is embedded.  No fever or rash.  Patient reports headache x 1 week.  The history is provided by the patient and medical records.    Past Medical History:  Diagnosis Date   Diabetes mellitus without complication (HCC)    History of kidney stones    Kidney stone 2012   Menorrhagia    Polycystic ovarian disease    PONV (postoperative nausea and vomiting)    vomited x2 in OR during previous C-section   UTI (lower urinary tract infection)     Patient Active Problem List   Diagnosis Date Noted   Retained products of conception after miscarriage 07/26/2023   Previous cesarean section 07/08/2021   Postoperative state 07/08/2021   Status post cesarean delivery 01/03/2017   Right flank pain 09/27/2016   Amenorrhea 01/02/2014   Diabetes mellitus, new onset (HCC) 12/15/2013   Screening for ischemic heart disease 12/15/2013    Past Surgical History:  Procedure Laterality Date   CESAREAN SECTION N/A 01/03/2017   Procedure: CESAREAN SECTION;  Surgeon: Kris Pester, MD;  Location: [facility];  Service: Obstetrics;  Laterality: N/A;   CESAREAN SECTION N/A 07/08/2021   Procedure: REPEAT CESAREAN SECTION;  Surgeon: Madelene Schanz, Joselyn Nicely, MD;  Location: [facility];  Service: Obstetrics;  Laterality: N/A;   DILATION AND EVACUATION N/A 07/27/2023   Procedure: Suction DILATATION AND  CURETTAGE;  Surgeon: Prescilla Brod, MD;  Location: [facility];  Service: Gynecology;  Laterality: N/A;   NO PAST SURGERIES     WISDOM TOOTH EXTRACTION      OB History     Gravida  4   Para  2   Term  2   Preterm  0   AB  0   Living  2      SAB  0   IAB  0   Ectopic  0   Multiple  0   Live Births  2            Home Medications    Prior to Admission medications   Medication Sig Start Date End Date Taking? Authorizing Provider  folic acid (FOLVITE) 1 MG tablet Take 4 mg by mouth. 02/13/24 02/12/25 Yes [provider]  Insulin  Glargine (BASAGLAR KWIKPEN) 100 UNIT/ML Inject 20 Units into the skin. 01/30/24  Yes [provider]  acetaminophen  (TYLENOL ) 500 MG tablet Take 2 tablets (1,000 mg total) by mouth every 6 (six) hours. 07/09/21   Collin Deal, CNM  calcium  carbonate (TUMS - DOSED IN MG ELEMENTAL CALCIUM ) 500 MG chewable tablet Chew 1 tablet by mouth daily as needed for indigestion or heartburn.    [provider]  cetirizine (ZYRTEC) 10 MG tablet Take 10 mg by mouth daily.  [provider]  fluticasone (FLONASE) 50 MCG/ACT nasal spray Place 2 sprays into the nose daily as needed.    [provider]  glipiZIDE  (GLUCOTROL ) 5 MG tablet Take 0.5 tablets (2.5 mg total) by mouth at bedtime. Patient not taking: Reported on 03/08/2024 07/09/21 07/09/22  Collin Deal, CNM  ibuprofen  (ADVIL ) 600 MG tablet Take 1 tablet (600 mg total) by mouth every 6 (six) hours. 07/09/21   Collin Deal, CNM  metFORMIN  (GLUCOPHAGE ) 1000 MG tablet Take 1 tablet (1,000 mg total) by mouth 2 (two) times daily with a meal. 07/09/21 07/09/22  Collin Deal, CNM  Prenatal Vit-Fe Fumarate-FA (MULTIVITAMIN-PRENATAL) 27-0.8 MG TABS tablet Take 1 tablet by mouth daily.    [provider]  senna-docusate (SENOKOT-S) 8.6-50 MG tablet Take 2 tablets by mouth daily. 07/10/21   Collin Deal, CNM  simethicone  (MYLICON) 80 MG chewable tablet Chew 1  tablet (80 mg total) by mouth 3 (three) times daily after meals. 07/09/21   Collin Deal, CNM    Family History Family History  Problem Relation Age of Onset   Heart disease Maternal Grandmother    Thyroid cancer Maternal Grandmother    Diabetes type II Paternal Grandfather    Arthritis Paternal Grandmother    Hypertension Paternal Grandmother    Diabetes Paternal Grandmother    Diabetes Mother     Social History Social History   Tobacco Use   Smoking status: Never   Smokeless tobacco: Never  Vaping Use   Vaping status: Never Used  Substance Use Topics   Alcohol use: No   Drug use: No     Allergies   Patient has no known allergies.   Review of Systems Review of Systems  Constitutional:  Negative for chills and fever.  Musculoskeletal:  Negative for arthralgias and joint swelling.  Skin:  Positive for wound. Negative for color change and rash.  Neurological:  Positive for headaches. Negative for weakness and numbness.     Physical Exam Triage Vital Signs ED Triage Vitals  Encounter Vitals Group     BP 03/08/24 1037 113/71     Systolic BP Percentile --      Diastolic BP Percentile --      Pulse Rate 03/08/24 1037 81     Resp 03/08/24 1037 18     Temp 03/08/24 1037 98 F (36.7 C)     Temp src --      SpO2 03/08/24 1037 98 %     Weight --      Height --      Head Circumference --      Peak Flow --      Pain Score 03/08/24 1044 0     Pain Loc --      Pain Education --      Exclude from Growth Chart --    No data found.  Updated Vital Signs BP 113/71   Pulse 81   Temp 98 F (36.7 C)   Resp 18   LMP 05/18/2023 (Exact Date)   SpO2 98%   Visual Acuity Right Eye Distance:   Left Eye Distance:   Bilateral Distance:    Right Eye Near:   Left Eye Near:    Bilateral Near:     Physical Exam Constitutional:      General: She is not in acute distress. HENT:     Mouth/Throat:     Mouth: Mucous membranes are moist.  Cardiovascular:     Rate  and Rhythm: Normal rate and  regular rhythm.  Pulmonary:     Effort: Pulmonary effort is normal. No respiratory distress.  Skin:    General: Skin is warm and dry.     Findings: Lesion present. No erythema.     Comments: Left upper back has small lesion with central black foreign body that appears to be a tick head.  No purulent drainage or erythema.  Neurological:     General: No focal deficit present.     Mental Status: She is alert.     Sensory: No sensory deficit.     Motor: No weakness.      UC Treatments / Results  Labs (all labs ordered are listed, but only abnormal results are displayed) Labs Reviewed  SPOTTED FEVER GROUP ANTIBODIES  LYME DISEASE SEROLOGY W/REFLEX    EKG   Radiology No results found.  Procedures Foreign Body Removal  Date/Time: 03/08/2024 11:09 AM  Performed by: Wellington Half, NP Authorized by: Wellington Half, NP   Consent:    Consent obtained:  Verbal   Consent given by:  Patient   Risks discussed:  Bleeding, infection, pain and poor cosmetic result Universal protocol:    Procedure explained and questions answered to patient or proxy's satisfaction: yes   Location:    Location:  Trunk   Trunk location:  Upper back Anesthesia:    Anesthesia method:  Local infiltration   Local anesthetic:  Lidocaine  1% w/o epi Procedure type:    Procedure complexity:  Simple Procedure details:    Removal mechanism:  Forceps   Foreign bodies recovered:  1   Description:  Tick head Post-procedure details:    Neurovascular status: intact     Confirmation:  No additional foreign bodies on visualization   Skin closure:  None   Dressing:  Antibiotic ointment and non-adherent dressing   Procedure completion:  Tolerated well, no immediate complications  (including critical care time)  Medications Ordered in UC Medications - No data to display  Initial Impression / Assessment and Plan / UC Course  I have reviewed the triage vital signs and the nursing  notes.  Pertinent labs & imaging results that were available during my care of the patient were reviewed by me and considered in my medical decision making (see chart for details).    Foreign body in soft tissue, tick bite on upper back, 13 weeks pregnancy.  Afebrile and vital signs are stable.  No fever or rash.  Patient has a headache.  Tick head removed here today.  Education provided on tick bites.  Baseline RMSF and Lyme drawn today and pending.  Instructed patient to follow-up with her PCP on Monday for guidance on additional future blood work.  Tylenol  as needed.  Education provided on second trimester pregnancy.  ED precautions given.  Patient agrees to plan of care.  Final Clinical Impressions(s) / UC Diagnoses   Final diagnoses:  Foreign body (FB) in soft tissue  Tick bite of back, initial encounter  [redacted] weeks gestation of pregnancy     Discharge Instructions      Keep your wound clean and dry.  Wash it gently twice a day with soap and water, then apply an antibiotic ointment and bandage.    Please follow-up with your primary care provider on Monday.    Lyme and [facility] spotted fever blood work pending.  See the attached information on tick bites.    ED Prescriptions   None    PDMP not reviewed this encounter.  Wellington Half, NP 03/08/24 1114

## 2024-03-11 LAB — SPOTTED FEVER GROUP ANTIBODIES
Spotted Fever Group IgG: <1:64 {titer}
Spotted Fever Group IgM: <1:64 {titer}

## 2024-03-11 LAB — LYME DISEASE SEROLOGY W/REFLEX: Lyme Total Antibody EIA: NEGATIVE

## 2024-03-12 ENCOUNTER — Emergency Department

## 2024-03-12 ENCOUNTER — Emergency Department
Admission: EM | Admit: 2024-03-12 | Discharge: 2024-03-12 | Disposition: A | Discharge status: Home / Self Care | Attending: Emergency Medicine | Admitting: Emergency Medicine

## 2024-03-12 ENCOUNTER — Other Ambulatory Visit: Payer: Self-pay

## 2024-03-12 ENCOUNTER — Encounter: Payer: Self-pay | Admitting: Emergency Medicine

## 2024-03-12 DIAGNOSIS — O26892 Other specified pregnancy related conditions, second trimester: Secondary | ICD-10-CM | POA: Insufficient documentation

## 2024-03-12 DIAGNOSIS — E119 Type 2 diabetes mellitus without complications: Secondary | ICD-10-CM | POA: Insufficient documentation

## 2024-03-12 DIAGNOSIS — R519 Headache, unspecified: Secondary | ICD-10-CM | POA: Diagnosis not present

## 2024-03-12 DIAGNOSIS — Z3A14 14 weeks gestation of pregnancy: Secondary | ICD-10-CM | POA: Insufficient documentation

## 2024-03-12 LAB — BASIC METABOLIC PANEL WITH GFR
Anion gap: 10 (ref 5–15)
BUN: 8 mg/dL (ref 6–20)
CO2: 20 mmol/L — ABNORMAL LOW (ref 22–32)
Calcium: 9.3 mg/dL (ref 8.9–10.3)
Chloride: 105 mmol/L (ref 98–111)
Creatinine, Ser: 0.45 mg/dL (ref 0.44–1.00)
GFR, Estimated: >60 mL/min (ref >60)
Glucose, Bld: 113 mg/dL — ABNORMAL HIGH (ref 70–99)
Potassium: 3.6 mmol/L (ref 3.5–5.1)
Sodium: 135 mmol/L (ref 135–145)

## 2024-03-12 LAB — CBC WITH DIFFERENTIAL/PLATELET
Abs Immature Granulocytes: 0.03 10*3/uL (ref 0.00–0.07)
Basophils Absolute: 0 10*3/uL (ref 0.0–0.1)
Basophils Relative: 1 %
Eosinophils Absolute: 0.1 10*3/uL (ref 0.0–0.5)
Eosinophils Relative: 1 %
HCT: 33.8 % — ABNORMAL LOW (ref 36.0–46.0)
Hemoglobin: 11.3 g/dL — ABNORMAL LOW (ref 12.0–15.0)
Immature Granulocytes: 0 %
Lymphocytes Relative: 31 %
Lymphs Abs: 2.7 10*3/uL (ref 0.7–4.0)
MCH: 28.5 pg (ref 26.0–34.0)
MCHC: 33.4 g/dL (ref 30.0–36.0)
MCV: 85.1 fL (ref 80.0–100.0)
Monocytes Absolute: 0.4 10*3/uL (ref 0.1–1.0)
Monocytes Relative: 4 %
Neutro Abs: 5.6 10*3/uL (ref 1.7–7.7)
Neutrophils Relative %: 63 %
Platelets: 322 10*3/uL (ref 150–400)
RBC: 3.97 MIL/uL (ref 3.87–5.11)
RDW: 13 % (ref 11.5–15.5)
WBC: 8.8 10*3/uL (ref 4.0–10.5)
nRBC: 0 % (ref 0.0–0.2)

## 2024-03-12 LAB — URINALYSIS, ROUTINE W REFLEX MICROSCOPIC
Bilirubin Urine: NEGATIVE
Glucose, UA: NEGATIVE mg/dL
Hgb urine dipstick: NEGATIVE
Ketones, ur: NEGATIVE mg/dL
Leukocytes,Ua: NEGATIVE
Nitrite: NEGATIVE
Protein, ur: NEGATIVE mg/dL
Specific Gravity, Urine: 1.006 (ref 1.005–1.030)
pH: 6 (ref 5.0–8.0)

## 2024-03-12 MED ORDER — SODIUM CHLORIDE 0.9 % IV BOLUS
500.0000 mL | Freq: Once | INTRAVENOUS | Status: AC
  Administered 2024-03-12: 500 mL via INTRAVENOUS

## 2024-03-12 MED ORDER — DIPHENHYDRAMINE HCL 50 MG/ML IJ SOLN
25.0000 mg | Freq: Once | INTRAMUSCULAR | Status: AC
  Administered 2024-03-12: 25 mg via INTRAVENOUS
  Filled 2024-03-12: qty 1

## 2024-03-12 MED ORDER — METOCLOPRAMIDE HCL 10 MG PO TABS: 10.0000 mg | ORAL_TABLET | Freq: Three times a day (TID) | ORAL | 0 refills | Status: DC

## 2024-03-12 MED ORDER — METOCLOPRAMIDE HCL 5 MG/ML IJ SOLN
10.0000 mg | Freq: Once | INTRAMUSCULAR | Status: AC
  Administered 2024-03-12: 10 mg via INTRAVENOUS
  Filled 2024-03-12: qty 2

## 2024-03-12 NOTE — Discharge Instructions (Signed)
 Your exam, labs, CT, and MRI overall reassuring.  You have some microcalcifications of the basal ganglia.  These findings at this point are nonspecific, and likely do not correspond to your headache syndrome.  You should follow-up with neurology as discussed.  Take the prescription meds as discussed for headache management.  Follow-up with the [facility] provider for routine OB care.  Return to the ED if needed.

## 2024-03-12 NOTE — ED Triage Notes (Signed)
 Patient to ED via POV for headache- ongoing x2 weeks. Currently [redacted] weeks pregnant. Given at home headache cocktail by OB but not gone away.

## 2024-03-12 NOTE — ED Provider Notes (Signed)
 [facility] Emergency Department Provider Note     Event Date/Time   First MD Initiated Contact with Patient 03/12/24 1532     (approximate)   History   Headache   HPI  [name] is a 33 y.o. female G4P2, at [redacted] weeks gestation with a single IUP, presents to the ED endorsing persistent headache for the last 2 weeks.  Patient was apparently advised on an at-home headache cocktail by her OB provider.  She had been given instructions to take OTC Tylenol , Phenergan, and Benadryl  for headache management.  Patient endorses significant sedation with a headache cocktail and only takes it sporadically at night.  She presents to the ED endorsing ongoing headache pain.  She denies any vision change, weakness, slurred speech, tinnitus, vertigo, or syncope.  Patient's medical history includes diabetes, kidney stones, and PCOS.  She denies any pregnancy related complaints at this time.  Physical Exam   Triage Vital Signs: ED Triage Vitals [03/12/24 1453]  Encounter Vitals Group     BP 119/63     Systolic BP Percentile      Diastolic BP Percentile      Pulse Rate 83     Resp 17     Temp 98.4 F (36.9 C)     Temp Source Oral     SpO2 100 %     Weight 196 lb (88.9 kg)     Height 5\' 4"  (1.626 m)     Head Circumference      Peak Flow      Pain Score 6     Pain Loc      Pain Education      Exclude from Growth Chart     Most recent vital signs: Vitals:   03/12/24 1453 03/12/24 2152  BP: 119/63 121/82  Pulse: 83 82  Resp: 17 18  Temp: 98.4 F (36.9 C) 98.3 F (36.8 C)  SpO2: 100% 100%    General Awake, no distress. NAD HEENT NCAT. PERRL. EOMI. No rhinorrhea. Mucous membranes are moist.  TMs intact bilaterally. CV:  Good peripheral perfusion.  RESP:  Normal effort.  ABD:  No distention.  NEURO: Cranial nerves II to XII grossly intact.  No cerebellar ataxia appreciated.   ED Results / Procedures / Treatments   Labs (all labs ordered are listed,  but only abnormal results are displayed) Labs Reviewed  CBC WITH DIFFERENTIAL/PLATELET - Abnormal; Notable for the following components:      Result Value   Hemoglobin 11.3 (*)    HCT 33.8 (*)    All other components within normal limits  BASIC METABOLIC PANEL WITH GFR - Abnormal; Notable for the following components:   CO2 20 (*)    Glucose, Bld 113 (*)    All other components within normal limits  URINALYSIS, ROUTINE W REFLEX MICROSCOPIC - Abnormal; Notable for the following components:   Color, Urine STRAW (*)    APPearance CLEAR (*)    All other components within normal limits    EKG    RADIOLOGY  I personally viewed and evaluated these images as part of my medical decision making, as well as reviewing the written report by the radiologist.  ED Provider Interpretation: No acute hemorrhage noted.  Incidental finding of calcification to the basal ganglia on CT and MRI.  MR BRAIN WO CONTRAST Result Date: 03/12/2024 EXAM: MRI BRAIN WITHOUT CONTRAST 03/12/2024 08:40:54 PM TECHNIQUE: Multiplanar multisequence MRI of the head/brain was performed without the administration of  intravenous contrast. COMPARISON: CT head without contrast 03/12/2024. CLINICAL HISTORY: Headache, increasing frequency or severity.  Currently [redacted] weeks pregnant. FINDINGS: BRAIN AND VENTRICLES: No acute infarct. No intracranial hemorrhage. No mass. No midline shift. No hydrocephalus. The sella is unremarkable. Normal flow voids. Focal susceptibility is present in the right caudate head. Mild T1 shortening is also present. Findings are consistent with microcalcifications. Additional calcifications are present in the globus pallidus bilaterally. ORBITS: No acute abnormality. SINUSES AND MASTOIDS: No acute abnormality. BONES AND SOFT TISSUES: Normal marrow signal. No acute soft tissue abnormality. IMPRESSION: 1. No acute intracranial abnormality. 2. 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 Electronically signed by: Audree Leas MD 03/12/2024 09:01 PM EDT RP Workstation: [id number]   CT HEAD WO CONTRAST ( ) Result Date: 03/12/2024 CLINICAL DATA:  Headache, increasing frequency or severity [redacted] weeks pregnant with new headaches EXAM: CT HEAD WITHOUT CONTRAST TECHNIQUE: Contiguous axial images were obtained from the base of the skull through the vertex without intravenous contrast. RADIATION DOSE REDUCTION: This exam was performed according to the departmental dose-optimization program which includes automated exposure control, adjustment of the mA and/or kV according to patient size and/or use of iterative reconstruction technique. COMPARISON:  None Available. FINDINGS: Brain: No evidence of large-territorial acute infarction. No definite parenchymal hemorrhage. A 0.8 x 0.4 cm right caudate head hyperdensity. Nonspecific bilateral basal ganglia mineralization. No extra-axial collection. No mass effect or midline shift. No hydrocephalus. Basilar cisterns are patent. Vascular: No hyperdense vessel. Skull: No acute fracture or focal lesion. Sinuses/Orbits: Paranasal sinuses and mastoid air cells are clear. The orbits are unremarkable. Other: None. IMPRESSION: A 0.8 x 0.4 cm caudate head ganglia hyperdensity. Indeterminate finding with underlying mass not fully excluded. No definite hemorrhage. Given patient is pregnant, recommend MRI without contrast for further evaluation. Electronically Signed   By: Morgane  Naveau M.D.   On: 03/12/2024 17:34     PROCEDURES:  Critical Care performed: No  Procedures   MEDICATIONS ORDERED IN ED: Medications  sodium chloride  0.9 % bolus 500 mL (0 mLs Intravenous Stopped 03/12/24 1723)  metoCLOPramide (REGLAN) injection 10 mg (10 mg Intravenous Given 03/12/24 1629)  diphenhydrAMINE  (BENADRYL ) injection 25 mg (25 mg Intravenous Given 03/12/24 1627)     IMPRESSION  / MDM / ASSESSMENT AND PLAN / ED COURSE  I reviewed the triage vital signs and the nursing notes.                              Differential diagnosis includes, but is not limited to, intracranial hemorrhage, meningitis/encephalitis, previous head trauma, cavernous venous thrombosis, tension headache, temporal arteritis, migraine or migraine equivalent, idiopathic intracranial hypertension, and non-specific headache.   Patient's presentation is most consistent with acute complicated illness / injury requiring diagnostic workup.  Patient's diagnosis is consistent with acute headache of unclear etiology.  Female patient in second trimester pregnancy, presents with 2 weeks of persistent headache likely represent a cluster headache syndrome.  Exam is reassuring with no red flags, no evidence of any cerebellar ataxia, and no neuromuscular deficits.  Labs are reassuring without any acute leukocytosis, critical anemia, or electrolyte abnormalities.  CT images initially showing some undifferentiated hypodensity at the head of the basal ganglia.  Reevaluation by MRI, shows some microcalcifications in the same area.  Unclear if this represents any acute headache syndrome.  Patient stable at this time for outpatient management.  She is  reassured by her workup overall and endorsing significant movement of headache at the time of this disposition.  Patient will be discharged home with prescriptions for Reglan to dose and lieu of Phenergan. Patient is to follow up with neurology as discussed, as needed or otherwise directed. Patient is given ED precautions to return to the ED for any worsening or new symptoms.  Clinical Course as of 03/12/24 2231  Wed Mar 12, 2024  1830 Patient and husband made aware of CT scan results, and recommendation for further evaluation with noncontrast MRI.  Patient is agreeable to proceed at this time.  She is endorsing her headache is significantly improved to a 3 out of 10 at this time.  [JM]    Clinical Course User Index [JM] Katalyn Matin, Raye Cai, PA-C    FINAL CLINICAL IMPRESSION(S) / ED DIAGNOSES   Final diagnoses:  Acute intractable headache, unspecified headache type     Rx / DC Orders   ED Discharge Orders          Ordered    metoCLOPramide (REGLAN) 10 MG tablet  3 times daily with meals        03/12/24 1831             Note:  This document was prepared using Dragon voice recognition software and may include unintentional dictation errors.    May Sparks, PA-C 03/12/24 2232    Kandee Orion, MD 03/12/24 [phone]

## 2024-03-14 ENCOUNTER — Other Ambulatory Visit: Payer: Self-pay | Admitting: Physician Assistant

## 2024-03-14 ENCOUNTER — Encounter: Payer: Self-pay | Admitting: Physician Assistant

## 2024-03-14 DIAGNOSIS — R9089 Other abnormal findings on diagnostic imaging of central nervous system: Secondary | ICD-10-CM

## 2024-03-14 DIAGNOSIS — R519 Headache, unspecified: Secondary | ICD-10-CM

## 2024-03-18 ENCOUNTER — Ambulatory Visit
Admission: RE | Admit: 2024-03-18 | Discharge: 2024-03-18 | Disposition: A | Discharge status: Home / Self Care | Source: Ambulatory Visit | Attending: Physician Assistant | Admitting: Physician Assistant

## 2024-03-18 DIAGNOSIS — R9089 Other abnormal findings on diagnostic imaging of central nervous system: Secondary | ICD-10-CM

## 2024-03-18 DIAGNOSIS — R519 Headache, unspecified: Secondary | ICD-10-CM

## 2024-03-20 DIAGNOSIS — R519 Headache, unspecified: Secondary | ICD-10-CM | POA: Insufficient documentation

## 2024-03-20 DIAGNOSIS — R9089 Other abnormal findings on diagnostic imaging of central nervous system: Secondary | ICD-10-CM | POA: Insufficient documentation

## 2024-04-23 ENCOUNTER — Ambulatory Visit

## 2024-04-28 ENCOUNTER — Other Ambulatory Visit: Payer: Self-pay

## 2024-04-28 DIAGNOSIS — O24111 Pre-existing diabetes mellitus, type 2, in pregnancy, first trimester: Secondary | ICD-10-CM

## 2024-04-28 DIAGNOSIS — Z8279 Family history of other congenital malformations, deformations and chromosomal abnormalities: Secondary | ICD-10-CM

## 2024-04-30 ENCOUNTER — Other Ambulatory Visit: Payer: Self-pay

## 2024-04-30 ENCOUNTER — Ambulatory Visit: Attending: Maternal & Fetal Medicine

## 2024-04-30 DIAGNOSIS — O34219 Maternal care for unspecified type scar from previous cesarean delivery: Secondary | ICD-10-CM | POA: Diagnosis not present

## 2024-04-30 DIAGNOSIS — E669 Obesity, unspecified: Secondary | ICD-10-CM

## 2024-04-30 DIAGNOSIS — Z8489 Family history of other specified conditions: Secondary | ICD-10-CM | POA: Insufficient documentation

## 2024-04-30 DIAGNOSIS — Z7984 Long term (current) use of oral hypoglycemic drugs: Secondary | ICD-10-CM

## 2024-04-30 DIAGNOSIS — E119 Type 2 diabetes mellitus without complications: Secondary | ICD-10-CM | POA: Diagnosis not present

## 2024-04-30 DIAGNOSIS — Z363 Encounter for antenatal screening for malformations: Secondary | ICD-10-CM | POA: Diagnosis not present

## 2024-04-30 DIAGNOSIS — Z3A21 21 weeks gestation of pregnancy: Secondary | ICD-10-CM | POA: Insufficient documentation

## 2024-04-30 DIAGNOSIS — O24111 Pre-existing diabetes mellitus, type 2, in pregnancy, first trimester: Secondary | ICD-10-CM

## 2024-04-30 DIAGNOSIS — O99212 Obesity complicating pregnancy, second trimester: Secondary | ICD-10-CM

## 2024-04-30 DIAGNOSIS — Z8279 Family history of other congenital malformations, deformations and chromosomal abnormalities: Secondary | ICD-10-CM | POA: Diagnosis present

## 2024-04-30 DIAGNOSIS — O24112 Pre-existing diabetes mellitus, type 2, in pregnancy, second trimester: Secondary | ICD-10-CM | POA: Insufficient documentation

## 2024-05-21 NOTE — Progress Notes (Signed)
 Patient's last menstrual period was 12/04/2023 (approximate). Estimated Date of Delivery: 09/09/24  33 y.o. H4E6977 at [redacted]w[redacted]d  The primary encounter diagnosis was Supervision of high risk pregnancy in second trimester (HHS-HCC). Diagnoses of Previous cesarean section, Pre-existing type 2 diabetes mellitus during pregnancy in second trimester (HHS-HCC), Obesity in pregnancy, antepartum (HHS-HCC), and Family history of congenital cardiac septal defect were also pertinent to this visit.  S:   Patient concerns today:  - headache, had a bad headache last week that caused her to be in tears from the pain. Has a prescription for Fioricet but is scared to take it while pregnant  Chief Complaint  Patient presents with  . Routine Prenatal Visit    Reports: good fetal movement  Denies bleeding, contractions, cramping or leaking.   O:   See [facility] flowsheets. BP 100/66   Pulse 86   Wt 89.8 kg (198 lb)   LMP 12/04/2023 (Approximate)   BMI 33.97 kg/m  Gen: NAD  Pulm: No use of accessory muscles, normal respirations Abdomen: Gravid, nontender  Fundal height: 27cm  FHT by doppler (distinguished from mom): 145bpm  S=D Pelvic: SVE deferred  Ext : mild edema, no rashes Psych: Mood, insight, judgement intact  Blood Sugar Log Fasting: 77-101 (2/15 elevated) Breakfast: 86-120 (0/15 elevated) Lunch: 83-137 (6/14 elevated) Dinner: 101-134 (5/15 elevated)  A/P:  33 y.o. H4E6977 at [redacted]w[redacted]d   - Problem list reviewed and/or updated.  1. Supervision of high risk pregnancy in second trimester (HHS-HCC) CBC, and UA at 28wk visit.  Discussed diabetes screen in pregnancy.   TDAP vaccine discussed, recommended at 27-36 weeks.   She desires the TDAP vaccine at the next visit.  Contraception options reviewed - she has chosen an option.  Encouraged to review handouts   She has chosen Progestin- only pill and vasectomy.   Repeat anatomy US  with MFM next week  2. Previous cesarean  section Planning repeat cesarean section with Dr. ONEIDA. Schermerhorn  3. Pre-existing type 2 diabetes mellitus during pregnancy in second trimester (HHS-HCC) Fasting, breakfast, and dinner blood sugars are WNL. Elevated lunch blood sugars. Reviewed blood sugar log with Dr. Verdon and insulin  changed to Lantus 15 units in the morning and 15 units in the evening, added-in NPH 5 units with breakfast  4. Obesity in pregnancy, antepartum (HHS-HCC) TWG: -1.814 kg (-4 lb) Body mass index is 33.97 kg/m.  5. Family history of congenital cardiac septal defect Normal fetal echo 05/15/24    Reviewed early pregnancy precautions.  Call for bleeding and cramping  Return in about 2 weeks (around 06/04/2024) for pnc, blood sugar review, and c-section planning with T. Schermerhorn.   I personally performed the service, non-incident to. [facility])   EDSEL CHARLIES BLUSH , CNM 05/21/2024 9:13 AM

## 2024-05-28 ENCOUNTER — Ambulatory Visit: Attending: Obstetrics and Gynecology

## 2024-05-28 ENCOUNTER — Ambulatory Visit: Admitting: Obstetrics and Gynecology

## 2024-05-28 DIAGNOSIS — Z363 Encounter for antenatal screening for malformations: Secondary | ICD-10-CM | POA: Diagnosis not present

## 2024-05-28 DIAGNOSIS — E119 Type 2 diabetes mellitus without complications: Secondary | ICD-10-CM

## 2024-05-28 DIAGNOSIS — Z794 Long term (current) use of insulin: Secondary | ICD-10-CM

## 2024-05-28 DIAGNOSIS — Z8489 Family history of other specified conditions: Secondary | ICD-10-CM | POA: Insufficient documentation

## 2024-05-28 DIAGNOSIS — Z3A25 25 weeks gestation of pregnancy: Secondary | ICD-10-CM

## 2024-05-28 DIAGNOSIS — Z7984 Long term (current) use of oral hypoglycemic drugs: Secondary | ICD-10-CM | POA: Diagnosis not present

## 2024-05-28 DIAGNOSIS — E669 Obesity, unspecified: Secondary | ICD-10-CM | POA: Diagnosis not present

## 2024-05-28 DIAGNOSIS — O24112 Pre-existing diabetes mellitus, type 2, in pregnancy, second trimester: Secondary | ICD-10-CM | POA: Insufficient documentation

## 2024-05-28 DIAGNOSIS — O34219 Maternal care for unspecified type scar from previous cesarean delivery: Secondary | ICD-10-CM

## 2024-05-28 DIAGNOSIS — O99212 Obesity complicating pregnancy, second trimester: Secondary | ICD-10-CM | POA: Diagnosis not present

## 2024-05-28 DIAGNOSIS — O99213 Obesity complicating pregnancy, third trimester: Secondary | ICD-10-CM

## 2024-05-28 DIAGNOSIS — E6689 Other obesity not elsewhere classified: Secondary | ICD-10-CM

## 2024-05-28 NOTE — Progress Notes (Signed)
 Maternal-Fetal Medicine   Name: [name] MRN: [id number] G5 P2022 at 25w 1d gestation.  Patient is here for completion of fetal anatomy. - Type 2 diabetes.  Diagnosed about 3 years ago.  Patient takes Basaglar insulin  20 units at night and 15 units in the morning.  She reports her fasting levels are below 90 mg/dL and postprandials range between 120 and 130 mg/dL.  She takes metformin  1000 mg twice daily.  Short acting insulin  was stopped because of hypoglycemia. She does not have nephropathy or neuropathy.  Recent ophthalmology examination (June 2025) showed no evidence of proliferative retinopathy.  Patient takes low-dose aspirin prophylaxis for prevention of preeclampsia.  Blood pressure today at our office is 111/75 mmHg.  Fetal echocardiography was reported as normal.  Obstetrical history is significant for a term cesarean delivery in 2018 of a female infant weighing 8 pounds and 13 ounces at birth.  Her pregnancy was not complicated by gestational diabetes.  In 2022, she had a term elective cesarean delivery of a female infant weighing 8 pounds and 13 ounces.  She had diabetes in that pregnancy and reportedly well-controlled on insulin .  Ultrasound Fetal growth is appropriate for gestational age.  Amniotic fluid is normal good fetal activity seen.  Fetal anatomical survey was completed and appears normal.  Placenta is anterior and there is no evidence of previa or placenta accreta spectrum. As maternal obesity imposes limitations on the resolution of images, fetal anomalies may be missed.  Patient understands the limitations of ultrasound in detecting fetal anomalies.  Type 2 diabetes I discussed the importance of good blood glucose control to prevent fetal and neonatal adverse outcomes.  Insulin  requirements may increase with advancing gestation.  Patient is very well-motivated and educated.  Recent hemoglobin A1c was 5.2% that reflects good control of diabetes.  The incidence of  congenital malformation is very low with good diabetic control.  I discussed ultrasound protocol of monitoring pregnancies complicated with diabetes. I counseled her on timing of delivery.  Previous cesarean deliveries I reassured the patient of normal placental location that shows no evidence of placenta previa or placenta accreta spectrum.  I counseled the patient that repeat cesarean deliveries increase the risks of placenta previa and/or placenta accreta spectrum.  Patient is not planning to have more children, and her husband is planning to have vasectomy. I discussed bilateral tubal sterilization that can be performed with repeat cesarean delivery. We briefly discussed VBAC.  The risk of uterine scar dehiscence is about 2%.   Recommendations - Follow-up ultrasound may be performed at your office. - Fetal growth assessments every 4 weeks till delivery. - Weekly antenatal testing from [redacted] weeks gestation until delivery. - Delivery at [redacted] weeks gestation provide at diabetes is well-controlled.  Early term delivery (37 or 38 weeks) may be considered.  Consultation including face-to-face counseling (more than 50% of time spent) is 45 minutes.

## 2024-06-07 ENCOUNTER — Other Ambulatory Visit: Payer: Self-pay

## 2024-06-07 ENCOUNTER — Observation Stay
Admission: EM | Admit: 2024-06-07 | Discharge: 2024-06-07 | Disposition: A | Discharge status: Home / Self Care | Attending: Obstetrics and Gynecology | Admitting: Obstetrics and Gynecology

## 2024-06-07 ENCOUNTER — Encounter: Payer: Self-pay | Admitting: Obstetrics and Gynecology

## 2024-06-07 DIAGNOSIS — O99212 Obesity complicating pregnancy, second trimester: Secondary | ICD-10-CM | POA: Insufficient documentation

## 2024-06-07 DIAGNOSIS — O24112 Pre-existing diabetes mellitus, type 2, in pregnancy, second trimester: Secondary | ICD-10-CM | POA: Diagnosis not present

## 2024-06-07 DIAGNOSIS — Z3A26 26 weeks gestation of pregnancy: Secondary | ICD-10-CM | POA: Insufficient documentation

## 2024-06-07 DIAGNOSIS — O99891 Other specified diseases and conditions complicating pregnancy: Secondary | ICD-10-CM | POA: Diagnosis not present

## 2024-06-07 DIAGNOSIS — R519 Headache, unspecified: Secondary | ICD-10-CM | POA: Diagnosis not present

## 2024-06-07 DIAGNOSIS — E669 Obesity, unspecified: Secondary | ICD-10-CM | POA: Insufficient documentation

## 2024-06-07 DIAGNOSIS — N898 Other specified noninflammatory disorders of vagina: Principal | ICD-10-CM | POA: Diagnosis present

## 2024-06-07 DIAGNOSIS — O26812 Pregnancy related exhaustion and fatigue, second trimester: Secondary | ICD-10-CM | POA: Diagnosis present

## 2024-06-07 LAB — RUPTURE OF MEMBRANE (ROM)PLUS: Rom Plus: NEGATIVE

## 2024-06-07 LAB — WET PREP, GENITAL
Clue Cells Wet Prep HPF POC: NONE SEEN
Sperm: NONE SEEN
Trich, Wet Prep: NONE SEEN
WBC, Wet Prep HPF POC: <10 (ref <10)
Yeast Wet Prep HPF POC: NONE SEEN

## 2024-06-07 NOTE — Discharge Instructions (Signed)
 PRETERM LABOR: Includes any of the following symptoms that occur between 20-[redacted] weeks gestation. If these symptoms are not stopped, preterm labor can result in preterm delivery, placing your baby at risk.  Notify your doctor if any of the following occur: 1. Menstrual-like cramps   5. Pelvic pressure  2. Uterine contractions. These may be painless and feel like the uterus is tightening or the baby is balling up 6. Increase or change in vaginal discharge  3. Low, dull backache, unrelieved by heat or Tylenol   7. Vaginal bleeding  4. Intestinal cramps, with our without diarrhea, sometimes 8. A general feeling that something is not right   9. Leaking of fluid described as gas pain

## 2024-06-07 NOTE — Progress Notes (Signed)
 EFM applied and assessing. Fetal movement palpated and audible upon placing monitors. Patient reports feeling fetal movements x2 at that time.

## 2024-06-07 NOTE — Discharge Summary (Signed)
 Patient ID: [name] MRN: [id number] DOB/AGE: 03-29-1991 33 y.o.  Admit date: 06/07/2024 Discharge date: 06/07/2024  Admission Diagnoses: 33yo G5P2 at [redacted]w[redacted]d presents with c/o not feeling any fetal movement at all today. She also reports clear watery discharge. Denies UC, cramping or VB.   Discharge Diagnoses: Normal vaginal discharge, positive fetal movement, FHT appropriate for GA  Factors complicating pregnancy: Type II DM Headache:  congenitally hypoplastic L transverse sinus.  Previous LTCS x 2 Obesity  Family hx cardiac defect    Prenatal Procedures: none  Consults: None  Significant Diagnostic Studies:  Results for orders placed or performed during the hospital encounter of 06/07/24 (from the past week)  Wet prep, genital   Collection Time: 06/07/24  5:39 PM   Specimen: Vaginal  Result Value Ref Range   Yeast Wet Prep HPF POC NONE SEEN NONE SEEN   Trich, Wet Prep NONE SEEN NONE SEEN   Clue Cells Wet Prep HPF POC NONE SEEN NONE SEEN   WBC, Wet Prep HPF POC <10 <10   Sperm NONE SEEN   Rupture of Membrane (ROM) Plus   Collection Time: 06/07/24  5:39 PM  Result Value Ref Range   Rom Plus NEGATIVE     Treatments: none  Hospital Course:  This is a 33 y.o. H4E7977 with IUP at [redacted]w[redacted]d seen for decreased FM and discharge.   - Fetal monitoring: appropriate for GA - Wet prep and ROM+ both neg She was observed, fetal heart rate monitoring remained reassuring, and she had no signs/symptoms of labor or other maternal-fetal concerns. She was deemed stable for discharge to home with outpatient follow up.  Discharge Physical Exam:  BP 108/63 (BP Location: Left Arm)   Pulse 95   Temp 98.5 F (36.9 C) (Oral)   Resp 18   LMP 12/04/2023   General: NAD CV: RRR Pulm: nl effort ABD: s/nd/nt, gravid DVT Evaluation: LE non-ttp, no evidence of DVT on exam.  NST: (difficult to keep a continuous strip) FHR baseline: 135-140s bpm Variability: moderate Category/reactivity:  appropriate for GA   TOCO: quiet SVE: deferred      Discharge Condition: Stable  Disposition: Discharge disposition: 01-Home or Self Care        Allergies as of 06/07/2024   No Known Allergies      Medication List     STOP taking these medications    calcium  carbonate 500 MG chewable tablet Commonly known as: TUMS - dosed in mg elemental calcium    cetirizine 10 MG tablet Commonly known as: ZYRTEC   fluticasone 50 MCG/ACT nasal spray Commonly known as: FLONASE   glipiZIDE  5 MG tablet Commonly known as: GLUCOTROL    ibuprofen  600 MG tablet Commonly known as: ADVIL    metoCLOPramide  10 MG tablet Commonly known as: REGLAN    senna-docusate 8.6-50 MG tablet Commonly known as: Senokot-S   simethicone  80 MG chewable tablet Commonly known as: MYLICON       TAKE these medications    acetaminophen  500 MG tablet Commonly known as: TYLENOL  Take 2 tablets (1,000 mg total) by mouth every 6 (six) hours.   Basaglar KwikPen 100 UNIT/ML Inject 20 Units into the skin.   butalbital-acetaminophen -caffeine 50-325-40 MG tablet Commonly known as: FIORICET Take 1 tab at headache onset can repeat once in 4 hours if needed. No more than 2 doses in 24 hours.  Do not take more than 2-3 times weekly.   folic acid 1 MG tablet Commonly known as: FOLVITE Take 4 mg by mouth.   insulin   lispro 100 UNIT/ML injection Commonly known as: HUMALOG Inject 5 Units into the skin 3 (three) times daily as needed for high blood sugar (with high carb meals).   metFORMIN  500 MG tablet Commonly known as: GLUCOPHAGE  Take 1,000 mg by mouth 2 (two) times daily with a meal. What changed: Another medication with the same name was removed. Continue taking this medication, and follow the directions you see here.   multivitamin-prenatal 27-0.8 MG Tabs tablet Take 1 tablet by mouth daily.        Follow-up Information     [facility] Follow up.   Why: Keep all scheduled  appointments, Call with any questions or concerns Contact information: [address] [place]  [phone]                Signed:  DELON MYRON HOWARD 06/07/2024 6:34 PM

## 2024-06-07 NOTE — OB Triage Note (Signed)
 Patient arrived in triage with c/o not feeling any fetal movement at all today. Reports also noting a clear watery discharge. Notes underwear is wet, not soaked. Patient states she found baby's heart rate with doppler at home twice today. Patient reports having an anterior placenta, but normally feels movement all day. Patient given peripad and mesh underwear to assess fluid/discharge. DOROTHA Coe aware of patient en route to hospital. Plan to monitor with EFM and notify CNM of arrival.

## 2024-06-13 LAB — OB RESULTS CONSOLE RPR: RPR: NONREACTIVE

## 2024-06-13 LAB — OB RESULTS CONSOLE HIV ANTIBODY (ROUTINE TESTING): HIV: NONREACTIVE

## 2024-06-19 ENCOUNTER — Other Ambulatory Visit: Payer: Self-pay

## 2024-06-19 ENCOUNTER — Observation Stay

## 2024-06-19 ENCOUNTER — Observation Stay
Admission: EM | Admit: 2024-06-19 | Discharge: 2024-06-19 | Disposition: A | Discharge status: Home / Self Care | Attending: Certified Nurse Midwife | Admitting: Certified Nurse Midwife

## 2024-06-19 ENCOUNTER — Encounter: Payer: Self-pay | Admitting: Obstetrics and Gynecology

## 2024-06-19 DIAGNOSIS — O9A213 Injury, poisoning and certain other consequences of external causes complicating pregnancy, third trimester: Secondary | ICD-10-CM | POA: Diagnosis present

## 2024-06-19 DIAGNOSIS — Z3A28 28 weeks gestation of pregnancy: Secondary | ICD-10-CM | POA: Insufficient documentation

## 2024-06-19 DIAGNOSIS — R519 Headache, unspecified: Secondary | ICD-10-CM | POA: Insufficient documentation

## 2024-06-19 DIAGNOSIS — S3991XA Unspecified injury of abdomen, initial encounter: Secondary | ICD-10-CM | POA: Insufficient documentation

## 2024-06-19 DIAGNOSIS — O99213 Obesity complicating pregnancy, third trimester: Secondary | ICD-10-CM | POA: Diagnosis not present

## 2024-06-19 DIAGNOSIS — E669 Obesity, unspecified: Secondary | ICD-10-CM | POA: Insufficient documentation

## 2024-06-19 DIAGNOSIS — Z6834 Body mass index (BMI) 34.0-34.9, adult: Secondary | ICD-10-CM | POA: Diagnosis not present

## 2024-06-19 DIAGNOSIS — O99893 Other specified diseases and conditions complicating puerperium: Secondary | ICD-10-CM | POA: Diagnosis not present

## 2024-06-19 DIAGNOSIS — Z8279 Family history of other congenital malformations, deformations and chromosomal abnormalities: Secondary | ICD-10-CM | POA: Insufficient documentation

## 2024-06-19 DIAGNOSIS — Z79899 Other long term (current) drug therapy: Secondary | ICD-10-CM | POA: Insufficient documentation

## 2024-06-19 DIAGNOSIS — O34211 Maternal care for low transverse scar from previous cesarean delivery: Secondary | ICD-10-CM | POA: Diagnosis not present

## 2024-06-19 DIAGNOSIS — O24113 Pre-existing diabetes mellitus, type 2, in pregnancy, third trimester: Secondary | ICD-10-CM | POA: Diagnosis not present

## 2024-06-19 DIAGNOSIS — W19XXXA Unspecified fall, initial encounter: Secondary | ICD-10-CM | POA: Diagnosis not present

## 2024-06-19 LAB — CBC
HCT: 31.7 % — ABNORMAL LOW (ref 36.0–46.0)
Hemoglobin: 10.7 g/dL — ABNORMAL LOW (ref 12.0–15.0)
MCH: 28.5 pg (ref 26.0–34.0)
MCHC: 33.8 g/dL (ref 30.0–36.0)
MCV: 84.3 fL (ref 80.0–100.0)
Platelets: 299 K/uL (ref 150–400)
RBC: 3.76 MIL/uL — ABNORMAL LOW (ref 3.87–5.11)
RDW: 12.6 % (ref 11.5–15.5)
WBC: 8.4 K/uL (ref 4.0–10.5)
nRBC: 0 % (ref 0.0–0.2)

## 2024-06-19 MED ORDER — ACETAMINOPHEN 325 MG PO TABS: 650.0000 mg | ORAL_TABLET | ORAL | Status: DC | PRN

## 2024-06-19 MED ORDER — CALCIUM CARBONATE ANTACID 500 MG PO CHEW: 2.0000 | CHEWABLE_TABLET | ORAL | Status: DC | PRN

## 2024-06-19 NOTE — OB Triage Note (Signed)
 Discharge instructions, labor precautions, and follow-up care reviewed with patient. All questions answered. Patient verbalized understanding. Discharged ambulatory off unit.

## 2024-06-19 NOTE — Discharge Summary (Signed)
 [name] is a 33 y.o. female. She is at [redacted]w[redacted]d gestation. Patient's last menstrual period was 12/04/2023. Estimated Date of Delivery: 09/09/24  Prenatal care site: [facility]  Current pregnancy complicated by:  Type II DM Headache:  congenitally hypoplastic L transverse sinus.  Previous LTCS x 2 Obesity  Family hx cardiac defect   Chief complaint: fall yesterday at 5pm  She reports yesterday she fell on her right side and has a bruise and abrasion on the side and leg. She did not hit her head. She has felt baby move since the fall. She denies uterine contractions, pain, bleeding, or leaking fluid.  S: Resting comfortably. no CTX, no VB.no LOF,  Active fetal movement.  Denies: HA, visual changes, SOB, or RUQ/epigastric pain  Maternal Medical History:   Past Medical History:  Diagnosis Date   Diabetes mellitus without complication (HCC)    History of kidney stones    Kidney stone 2012   Menorrhagia    Polycystic ovarian disease    PONV (postoperative nausea and vomiting)    vomited x2 in OR during previous C-section   UTI (lower urinary tract infection)     Past Surgical History:  Procedure Laterality Date   CESAREAN SECTION N/A 01/03/2017   Procedure: CESAREAN SECTION;  Surgeon: Garnette JONETTA Mace, MD;  Location: [facility] ORS;  Service: Obstetrics;  Laterality: N/A;   CESAREAN SECTION N/A 07/08/2021   Procedure: REPEAT CESAREAN SECTION;  Surgeon: Lovetta, Debby PARAS, MD;  Location: [facility] ORS;  Service: Obstetrics;  Laterality: N/A;   DILATION AND EVACUATION N/A 07/27/2023   Procedure: Suction DILATATION AND CURETTAGE;  Surgeon: Verdon Keen, MD;  Location: [facility] ORS;  Service: Gynecology;  Laterality: N/A;   WISDOM TOOTH EXTRACTION      No Known Allergies  Prior to Admission medications   Medication Sig Start Date End Date Taking? Authorizing Provider  folic acid (FOLVITE) 1 MG tablet Take 4 mg by mouth. 02/13/24 02/12/25 Yes [provider]   Insulin  Glargine (BASAGLAR KWIKPEN) 100 UNIT/ML Inject 20 Units into the skin. 01/30/24  Yes [provider]  insulin  lispro (HUMALOG) 100 UNIT/ML injection Inject 5 Units into the skin 3 (three) times daily as needed for high blood sugar (with high carb meals).   Yes [provider]  metFORMIN  (GLUCOPHAGE ) 500 MG tablet Take 1,000 mg by mouth 2 (two) times daily with a meal.   Yes [provider]  Prenatal Vit-Fe Fumarate-FA (MULTIVITAMIN-PRENATAL) 27-0.8 MG TABS tablet Take 1 tablet by mouth daily.   Yes [provider]  acetaminophen  (TYLENOL ) 500 MG tablet Take 2 tablets (1,000 mg total) by mouth every 6 (six) hours. Patient not taking: Reported on 06/07/2024 07/09/21   Myron Nest, CNM  butalbital-acetaminophen -caffeine (FIORICET) 50-325-40 MG tablet Take 1 tab at headache onset can repeat once in 4 hours if needed. No more than 2 doses in 24 hours.  Do not take more than 2-3 times weekly. Patient not taking: Reported on 06/07/2024 03/13/24   [provider]    Social History: She  reports that she has never smoked. She has never used smokeless tobacco. She reports that she does not drink alcohol and does not use drugs.  Family History: family history includes Arthritis in her paternal grandmother; Diabetes in her mother and paternal grandmother; Diabetes type II in her paternal grandfather; Heart disease in her maternal grandmother; Hypertension in her paternal grandmother; Thyroid cancer in her maternal grandmother.  no history of gyn cancers  Review of Systems:  A full review of systems was performed and negative except as noted in the HPI.    O:  BP 113/60 (BP Location: Right Arm)   Pulse 96   Temp 98.1 F (36.7 C) (Oral)   Resp 18   Ht 5' 4 (1.626 m)   Wt 90.7 kg   LMP 12/04/2023   BMI 34.33 kg/m  Results for orders placed or performed during the hospital encounter of 06/19/24 (from the past 48 hours)  CBC   Collection Time: 06/19/24  12:29 PM  Result Value Ref Range   WBC 8.4 4.0 - 10.5 K/uL   RBC 3.76 (L) 3.87 - 5.11 MIL/uL   Hemoglobin 10.7 (L) 12.0 - 15.0 g/dL   HCT 68.2 (L) 63.9 - 53.9 %   MCV 84.3 80.0 - 100.0 fL   MCH 28.5 26.0 - 34.0 pg   MCHC 33.8 30.0 - 36.0 g/dL   RDW 87.3 88.4 - 84.4 %   Platelets 299 150 - 400 K/uL   nRBC 0.0 0.0 - 0.2 %     OB US : EXAM: ULTRASOUND OB LIMITED   CLINICAL HISTORY: 809823 Fall 190176; 379040 Abdominal trauma 379040; 379531 [redacted] weeks gestation of pregnancy [id number].   TECHNIQUE: Transabdominal obstetric pelvic ultrasound was performed with color Doppler flow evaluation.   COMPARISON: None provided.   FINDINGS:   FETUS: A single live intrauterine pregnancy is present, estimated gestational age [redacted] weeks and 2 days by LMP of 09/09/24.   POSITION: The fetus position is cephalic.   HEART RATE: Fetal heart rate measures 132 beats per minute.   PLACENTA: The placenta is located anterior. No previa. Placental distance to internal cervical os is 8.7 cm.   AMNIOTIC FLUID: The amniotic fluid volume is normal. Amniotic fluid index measures 10.2 cm.   BIOMETRICS: Estimated fetal weight is not provided.   BPD measures 7 cm which corresponds to a 28-week 1-day gestation.   CERVIX: Cervix is closed measuring 3.7 cm.   IMPRESSION: 1. Single live intrauterine pregnancy at 28 weeks 2 days with cephalic presentation, anterior placenta, and normal amniotic fluid index. 2. No evidence of placental previa.  Constitutional: NAD, AAOx3  HE/ENT: extraocular movements grossly intact, moist mucous membranes CV: RRR PULM: nl respiratory effort, CTABL     Abd: gravid, non-tender, non-distended, soft      Ext: Non-tender, Nonedematous   Psych: mood appropriate, speech normal Pelvic: deferred  Fetal  monitoring: Cat 1 Appropriate for GA Baseline: 135bpm Variability: moderate Accelerations: present x >2 Decelerations absent Uterine contractions: none Timing: 2  hours  A/P: 33 y.o. [redacted]w[redacted]d here for antenatal surveillance for fall and abdominal trauma in pregnancy  Principle Diagnosis:  Fall in pregnancy  Fall: no evidence of placenta abruption. Her CBC is stable. Her blood type is B positive. Her US  shows no evidence of placenta abruption. She was monitored for 4 hours, had a stable fetal heart rate tracing with no contractions, and was discharged home. Labor: not present.  Fetal Wellbeing: Reassuring Cat 1 tracing. Reactive NST  D/c home stable, precautions reviewed, follow-up as scheduled.    Edsel Charlies Blush, CNM 06/19/2024 6:57 PM

## 2024-06-19 NOTE — OB Triage Note (Signed)
 34 y.o G5P2 presents to L&D triage at [redacted]w[redacted]d after having a fall at home yesterday around 1730. She reports that she fell onto her right side and she has bruising and an abrasion on her knee/leg. She also c/o decreased fetal movement this morning, but has since reported movement since being in triage. Vital signs are wnl. She denies cramping/ctx's, vaginal bleeding, lof. Wilson CNM aware of pt arrival, POC includes prolonged fetal monitoring and ultrasound imaging. Fetal monitors applied and assessing w/ initial fht of 145.

## 2024-08-05 NOTE — H&P (Signed)
 [name] is a 33 y.o. female presenting for elective repeat c/s  Type 2 DM  Husband to get a vasectomy   63 y.o. [id number] at  Patient's last menstrual period was 12/04/2023 (approximate). consistent with   ultrasound @ [redacted]w[redacted]d on 01/22/2024.Estimated Date of Delivery: 09/09/24 Sex of baby and name:  [name]   Partner: [name]    Factors complicating this pregnancy  Type II DM Endocrinologist: Calton Skelton, PA - [facility] CGM goals  CGM glucose goals 63-140:  > 70% (> 16 hr + 48 min per day) >140: <25% (<6 hrs per day) <63:  <5% (1 hr + 20 min per day) <54: <1% (<15 min per day) Medications  Prepregnancy Meds: Metformin  and Monjouro  Low dose ASA - start at 12-16 weeks  01/22/2024: Metformin  500 mg BID, Semglee 20 units q HS 05/05/24: increase Lantus to 15 units HS, 10 units AM 05/13/24: increase Lantus to 20 units HS, 15 units AM (per patient message) 05/21/24: decrease Lantus to 15u BID and start NPH 5u with breakfast per [facility] 06/02/24: per Endo-Basaglar 15 units in am and 20 units in pm and added Humalog 5 units with meals IF high carb meal 03/31/24: per [facility]-see changes above 04/04/24: 5.2 Labs:  HgbA1C:          10/2023 - 6.3 02/06/24 - 5.8 04/04/24-5.2 07/08/24-5.3 Creatinine: 0.4 LFT:  14/11          EKG: - appt 02/27/24 Maternal echo (Type II DM and obesity): 02/27/24: ECHO 02/27/24-wnl Consults: Ophthalmology consult: 03/31/24-wnl exam MFM consult for anatomy US -appt 04/23/24 Antepartum management  Fetal echocardiogram (18-22w) appt 05/15/24-WNL Monthly growth US  in 3rd trimester  Weekly BPP at 32 weeks (consider twice weekly with NST at 34 weeks) Delivery: Well controlled - 38 to 39 weeks  Anemia 06/13/24: Hgb 10.9, start ferrous sulfate  325mg  daily Recheck @ 36wks Headache: Admitted to [facility] for concern for venous sinus thrombus.  Workup revealed: congenitally hypoplastic L transverse sinus. Supportive care per [facility] summary. Follow up with [facility] 04/01/24: Given lack  of clinical signs and symptoms of increased pressure (no vision changes, no diplopia, no signs of hemorrhage or ischemia), I favor this finding being congenital and therefore would not recommend anticoagulation at this time. She continues to have an intractable headache. -Recommend trying Fioricet, though cautioned her to limit usage to around 2-3 times a week if possible to avoid medication overuse/rebound headaches. Suggested she could also try Reglan . Previous LTCS x 2 Planning repeat LTCS (SDJ did first, TJS did second before SDJ was here), patient preference.  TJS doing repeat on 08/26/24  Subchorionic hemorrhage 02/13/24: Subchorionic hemorrhage seen: 1) inferior to IUP = 15 x 11 x 27 mm 2) right of IUP = 31 x 8 x 8 mm  Repeat US  in 4 weeks-SCH resolved on repeat sono Obesity  Pre-Pregnant BMI 33 Labs and antepartum management per T2DM plan  Family hx cardiac defect  Sister required heart surgery as child  Fetal echo per T2DM plan-appt 05/15/24-WNL  Screening results and needs: NOB:  Medicaid Questionnaire: completed 02/13/24 []  ACHD Program: Deferred from [facility] High Risk Pregnancy does not meet criteria.  Depression Score: 3 MBT:   B +         Ab screen:  Negative Hgb: 12.4  Platelets: 381 Hemoglobinopathy: Negative  HIV:  Neg    RPR:   NR Hep B: Negative     Hep C: NR Pap: 08/22/2023 NILM, hrHPV neg  G/C: Neg/Neg Rubella:  Immune     VZV:  Immune TSH: 0.930    A1C: 5.8 Genetics:  MaterniT21: Negative  CF/SMA carrier screening: Negative  28 weeks:  Review Medicaid Questionnaire: []  ACHD Program Depression Score: 0 Blood consent: Signed 06/13/24 Hgb: 10.9  Platelets: 314   GTT: n/a    Rhogam: n/a RPR: N/R   HIV: Negative 36 weeks:  GBS:   G/C:   Hgb:  Platelets:     Ultrasound:  01/22/2024: Single, viable IUP seen; CRL = 8.9 mm = [redacted]w[redacted]d, c/w dates; EDC by US  09/10/2024; FHT: 144 bpm; Cervical length: 5.33cm; Right ovary: CLC measuring 20 mm; Left ovary: WNL  02/13/24: SIUP  FHT=156 BPM. Cervical length=5.98 cm. 2 sch seen 1. Inferior to IUP=15 x 11 27mm   2.rt of IUP= 31 x 8 x 8mm. B/l ovaries within normal limits.    03/14/24: cephalic, ant plac, AFI-WNL 04/30/24: Num Of Fetuses:1, Fetal Heart Rate(bpm):  135, Cardiac Activity:Observed Presentation:Transverse, head to maternal left Placenta:Anterior P. Cord Insertion:Visualized, central  Amniotic Fluid AFI QC:Tpuypw normal limit Largest Pocket(cm) 3.73  05/28/24: MFM-breech, ant plac, EFW-729 g @24 %, AFI-WNL 06/30/24: Single IUP, [redacted]w[redacted]d, FHR 132bpm, cervical length 4.10cm, presentation cephalic, placenta anterior, AFI 11.8cm, EFW 1587g @65 %, 3lb8oz 07/14/24 SIUP, Pres=Cephalic, Plac=Anterior, FFHR 865 BPM, MVP 4.3 cm, AFI 13.9 cm, BPP 8/8 07/28/24:cephalic, ant plac, EFW-2406 g @ 62%, AFI- 12.4 cm, BPP 8/8 08/05/24: cephalic, ant plac, AFI 13.6 cm, BPP 8/8 Immunization:   Flu in season - given by endo 07/15/24 Tdap at 27-36 weeks - Given 06/13/24 JW COVID- declined  RSV in season at 32-36 weeks - given [facility] 07/28/24 Contraception Plan:  Feeding Plan: breast  Labor Plans:          OB History     Gravida  5   Para  2   Term  2   Preterm  0   AB  2   Living  2      SAB  2   IAB  0   Ectopic  0   Multiple  0   Live Births  2          Past Medical History:  Diagnosis Date   Diabetes mellitus without complication (HCC)    History of kidney stones    Kidney stone 2012   Menorrhagia    Polycystic ovarian disease    PONV (postoperative nausea and vomiting)    vomited x2 in OR during previous C-section   UTI (lower urinary tract infection)    Past Surgical History:  Procedure Laterality Date   CESAREAN SECTION N/A 01/03/2017   Procedure: CESAREAN SECTION;  Surgeon: Garnette JONETTA Mace, MD;  Location: [facility] ORS;  Service: Obstetrics;  Laterality: N/A;   CESAREAN SECTION N/A 07/08/2021   Procedure: REPEAT CESAREAN SECTION;  Surgeon: Lovetta, Debby PARAS, MD;  Location: [facility] ORS;  Service:  Obstetrics;  Laterality: N/A;   DILATION AND EVACUATION N/A 07/27/2023   Procedure: Suction DILATATION AND CURETTAGE;  Surgeon: Verdon Keen, MD;  Location: [facility] ORS;  Service: Gynecology;  Laterality: N/A;   WISDOM TOOTH EXTRACTION     Family History: family history includes Arthritis in her paternal grandmother; Diabetes in her mother and paternal grandmother; Diabetes type II in her paternal grandfather; Heart disease in her maternal grandmother; Hypertension in her paternal grandmother; Thyroid cancer in her maternal grandmother. Social History:  reports that she has never smoked. She has never used smokeless tobacco. She reports that she does not  drink alcohol and does not use drugs.      Review of Systems History   Last menstrual period 12/04/2023, unknown if currently breastfeeding. Exam Physical Exam  Bp 112/73 Lungs CTA  CV RRR Abd gravid  Prenatal labs: ABO, Rh:  B+ Antibody:  neg Rubella:  Imm , VZ Imm RPR:   NR  HBsAg:   neg , hep C NR HIV:   neg GBS:   Pending   Assessment/Plan: Elective repeat LTCS 38+0 weeks T2 DM The risks of cesarean section discussed with the patient included but were not limited to: bleeding which may require transfusion or reoperation; infection which may require antibiotics; injury to bowel, bladder, ureters or other surrounding organs; injury to the fetus; need for additional procedures including hysterectomy in the event of a life-threatening hemorrhage; placental abnormalities wth subsequent pregnancies, incisional problems, thromboembolic phenomenon and other postoperative/anesthesia complications. The patient concurred with the proposed plan, giving informed written consent for the procedure.   Anesthesia and OR aware. Preoperative prophylactic antibiotics and SCDs ordered on call to the OR.  To OR when ready.    Debby PARAS Yeshaya Vath 08/05/2024, 5:07 PM

## 2024-08-12 LAB — OB RESULTS CONSOLE GC/CHLAMYDIA
Chlamydia: NEGATIVE
Neisseria Gonorrhea: NEGATIVE

## 2024-08-12 LAB — OB RESULTS CONSOLE GBS: GBS: POSITIVE

## 2024-08-25 ENCOUNTER — Encounter: Payer: Self-pay | Admitting: Urgent Care

## 2024-08-25 ENCOUNTER — Other Ambulatory Visit: Payer: Self-pay

## 2024-08-25 ENCOUNTER — Encounter
Admission: RE | Admit: 2024-08-25 | Discharge: 2024-08-25 | Disposition: A | Discharge status: Home / Self Care | Source: Ambulatory Visit | Attending: Obstetrics and Gynecology | Admitting: Obstetrics and Gynecology

## 2024-08-25 VITALS — Ht 64.0 in | Wt 209.0 lb

## 2024-08-25 DIAGNOSIS — Z01812 Encounter for preprocedural laboratory examination: Secondary | ICD-10-CM | POA: Diagnosis present

## 2024-08-25 DIAGNOSIS — Z01818 Encounter for other preprocedural examination: Secondary | ICD-10-CM | POA: Diagnosis present

## 2024-08-25 DIAGNOSIS — O24111 Pre-existing diabetes mellitus, type 2, in pregnancy, first trimester: Secondary | ICD-10-CM

## 2024-08-25 LAB — BASIC METABOLIC PANEL WITH GFR
Anion gap: 13 (ref 5–15)
BUN: 6 mg/dL (ref 6–20)
CO2: 16 mmol/L — ABNORMAL LOW (ref 22–32)
Calcium: 8.4 mg/dL — ABNORMAL LOW (ref 8.9–10.3)
Chloride: 109 mmol/L (ref 98–111)
Creatinine, Ser: 0.39 mg/dL — ABNORMAL LOW (ref 0.44–1.00)
GFR, Estimated: >60 mL/min (ref >60)
Glucose, Bld: 118 mg/dL — ABNORMAL HIGH (ref 70–99)
Potassium: 3.6 mmol/L (ref 3.5–5.1)
Sodium: 138 mmol/L (ref 135–145)

## 2024-08-25 LAB — CBC
HCT: 30.6 % — ABNORMAL LOW (ref 36.0–46.0)
Hemoglobin: 9.7 g/dL — ABNORMAL LOW (ref 12.0–15.0)
MCH: 24.8 pg — ABNORMAL LOW (ref 26.0–34.0)
MCHC: 31.7 g/dL (ref 30.0–36.0)
MCV: 78.3 fL — ABNORMAL LOW (ref 80.0–100.0)
Platelets: 285 K/uL (ref 150–400)
RBC: 3.91 MIL/uL (ref 3.87–5.11)
RDW: 13.2 % (ref 11.5–15.5)
WBC: 7.4 K/uL (ref 4.0–10.5)
nRBC: 0 % (ref 0.0–0.2)

## 2024-08-25 LAB — TYPE AND SCREEN
ABO/RH(D): B POS
Antibody Screen: NEGATIVE
Extend sample reason: UNDETERMINED

## 2024-08-25 MED ORDER — SODIUM CHLORIDE 0.9 % IV SOLN: INTRAVENOUS | Status: DC

## 2024-08-25 MED ORDER — ORAL CARE MOUTH RINSE: 15.0000 mL | Freq: Once | OROMUCOSAL | Status: AC

## 2024-08-25 MED ORDER — CEFAZOLIN SODIUM-DEXTROSE 2-4 GM/100ML-% IV SOLN
2.0000 g | INTRAVENOUS | Status: AC
  Administered 2024-08-26: 2 g via INTRAVENOUS
  Filled 2024-08-25: qty 100

## 2024-08-25 MED ORDER — CHLORHEXIDINE GLUCONATE 0.12 % MT SOLN
15.0000 mL | Freq: Once | OROMUCOSAL | Status: AC
  Administered 2024-08-26: 15 mL via OROMUCOSAL
  Filled 2024-08-25: qty 15

## 2024-08-25 MED ORDER — SODIUM CHLORIDE 0.9 % IV SOLN
500.0000 mg | INTRAVENOUS | Status: DC
  Filled 2024-08-25: qty 5

## 2024-08-25 NOTE — Patient Instructions (Addendum)
 Your procedure is scheduled on: TUESDAY 08/26/24  Arrival Time: Please call Labor and Delivery the day before your scheduled C-Section to find out your arrival time. [phone].   Arrival: If your arrival time is prior to 6:00 am, please enter through the Emergency Room Entrance and you will be directed to Labor and Delivery. If your arrival time is 6:00 am or later, please enter the Medical Mall and follow the greeter's instructions.  REMEMBER: Instructions that are not followed completely may result in serious medical risk, up to and including death; or upon the discretion of your surgeon and anesthesiologist your surgery may need to be rescheduled.  Do not eat food after midnight the night before surgery.  No gum chewing or hard candies.  You may however, drink WATER up to 2 hours before you are scheduled to arrive for your surgery. Do not drink anything within 2 hours of your scheduled arrival time.  One week prior to surgery: Stop Anti-inflammatories (NSAIDS) such as Advil , Aleve, Ibuprofen , Motrin , Naproxen, Naprosyn and Aspirin based products such as Excedrin, Goody's Powder, BC Powder.  Stop ANY OVER THE COUNTER supplements until after surgery.  STOP metFORMIN  (GLUCOPHAGE ) until after surgery.   ONLY take 1/2 your night time dose of Insulin  Glargine [facility])   You may however, continue to take Tylenol  if needed for pain up until the day of surgery.  Continue taking all of your other prescription medications up until the day of surgery.  ON THE DAY OF SURGERY ONLY TAKE THESE MEDICATIONS WITH SIPS OF WATER:  NONE  Use inhalers on the day of surgery and bring to the hospital.  No Alcohol for 24 hours before or after surgery.  No Smoking including e-cigarettes for 24 hours prior to surgery.  No chewable tobacco products for at least 6 hours prior to surgery.  No nicotine patches on the day of surgery.  Do not use any recreational drugs for at least a week  prior to your surgery.  Please be advised that the combination of cocaine and anesthesia may have negative outcomes, up to and including death. If you test positive for cocaine, your surgery will be cancelled.  On the morning of surgery brush your teeth with toothpaste and water, you may rinse your mouth with mouthwash if you wish. Do not swallow any toothpaste or mouthwash.  Use CHG wipes as directed on instruction sheet.  Do not wear jewelry, make-up, hairpins, clips or nail polish.  For welded (permanent) jewelry: bracelets, anklets, waist bands, etc.  Please have this removed prior to surgery.  If it is not removed, there is a chance that hospital personnel will need to cut it off on the day of surgery.  Do not wear lotions, powders, or perfumes.   Do not shave body hair from the neck down 48 hours before surgery.  Contact lenses, hearing aids and dentures may not be worn into surgery.  Do not bring valuables to the hospital. [facility] is not responsible for any missing/lost belongings or valuables.   Notify your doctor if there is any change in your medical condition (cold, fever, infection).  Wear comfortable clothing (specific to your surgery type) to the hospital.  After surgery, you can help prevent lung complications by doing breathing exercises.  Take deep breaths and cough every 1-2 hours. Your doctor may order a device called an Incentive Spirometer to help you take deep breaths. When coughing or sneezing, hold a pillow firmly against your incision with both hands.  This is called "splinting." Doing this helps protect your incision. It also decreases belly discomfort.  Please call the Pre-admissions Testing Dept. at [phone] if you have any questions about these instructions.  Surgery Visitation Policy:  Visitor Passes   All visitors, including children, need an identification sticker when visiting. These stickers must be worn where they can be seen.   Labor &  Delivery  Laboring women may have one designated support person and two other visitors of any age visit. The support person must remain the same. The visitors may switch with other visitors. Visitation is permitted 24 hours per day. The designated support person or a visitor over the age of 16 may sleep overnight in the patient's room. A doula registered with [place] for labor and delivery support is not considered a visitor. Doulas not registered with  are considered visitors.  Mother Baby Unit, OB Specialty and Gynecological Care  A designated support person and three visitors of any age may visit. The three visitors may switch out. The designated support person or a visitor age 50 or older may stay overnight in the room. During the postpartum period (up to 6 weeks), if the mother is the patient, she can have her newborn stay with her if there is another support person present who can be responsible for the baby.                                                                                                             Preparing for Surgery with CHLORHEXIDINE  GLUCONATE (CHG) Soap  Chlorhexidine  Gluconate (CHG) Soap  o An antiseptic cleaner that kills germs and bonds with the skin to continue killing germs even after washing  o Used for showering the night before surgery and morning of surgery  Before surgery, you can play an important role by reducing the number of germs on your skin.  CHG (Chlorhexidine  gluconate) soap is an antiseptic cleanser which kills germs and bonds with the skin to continue killing germs even after washing.  Please do not use if you have an allergy to CHG or antibacterial soaps. If your skin becomes reddened/irritated stop using the CHG.  1. Shower the NIGHT BEFORE SURGERY with CHG soap.   2. If you choose to wash your hair, wash your hair first as usual with your normal shampoo.  3. After shampooing, rinse your hair and body thoroughly to  remove the shampoo.  4. Use CHG as you would any other liquid soap. You can apply CHG directly to the skin and wash gently with a clean washcloth.  5. Apply the CHG soap to your body only from the neck down. Do not use on open wounds or open sores. Avoid contact with your eyes, ears, mouth, and genitals (private parts). Wash face and genitals (private parts) with your normal soap.  6. Wash thoroughly, paying special attention to the area where your surgery will be performed.  7. Thoroughly rinse your body with warm water.  8. Do not shower/wash with your normal soap  after using and rinsing off the CHG soap.  9. Do not use lotions, oils, etc., after showering with CHG.  10. Pat yourself dry with a clean towel.  11. Wear clean pajamas to bed the night before surgery.  12. Place clean sheets on your bed the night of your shower and do not sleep with pets.  13. Do not apply any deodorants/lotions/powders.  14. Please wear clean clothes to the hospital.  15. Remember to brush your teeth with your regular toothpaste.

## 2024-08-26 ENCOUNTER — Other Ambulatory Visit: Payer: Self-pay

## 2024-08-26 ENCOUNTER — Inpatient Hospital Stay: Payer: Self-pay | Admitting: Certified Registered Nurse Anesthetist

## 2024-08-26 ENCOUNTER — Encounter: Payer: Self-pay | Admitting: Urgent Care

## 2024-08-26 ENCOUNTER — Inpatient Hospital Stay
Admission: RE | Admit: 2024-08-26 | Discharge: 2024-08-27 | DRG: 786 | Disposition: A | Discharge status: Home / Self Care | Source: Ambulatory Visit | Attending: Obstetrics and Gynecology | Admitting: Obstetrics and Gynecology

## 2024-08-26 ENCOUNTER — Encounter: Payer: Self-pay | Admitting: Obstetrics and Gynecology

## 2024-08-26 ENCOUNTER — Encounter
Admission: RE | Disposition: A | Discharge status: Home / Self Care | Payer: Self-pay | Source: Ambulatory Visit | Attending: Obstetrics and Gynecology

## 2024-08-26 DIAGNOSIS — Z9889 Other specified postprocedural states: Secondary | ICD-10-CM

## 2024-08-26 DIAGNOSIS — O2413 Pre-existing diabetes mellitus, type 2, in the puerperium: Secondary | ICD-10-CM | POA: Diagnosis present

## 2024-08-26 DIAGNOSIS — K219 Gastro-esophageal reflux disease without esophagitis: Secondary | ICD-10-CM | POA: Diagnosis present

## 2024-08-26 DIAGNOSIS — O34219 Maternal care for unspecified type scar from previous cesarean delivery: Secondary | ICD-10-CM | POA: Diagnosis present

## 2024-08-26 DIAGNOSIS — E66813 Obesity, class 3: Secondary | ICD-10-CM | POA: Diagnosis present

## 2024-08-26 DIAGNOSIS — O2412 Pre-existing diabetes mellitus, type 2, in childbirth: Secondary | ICD-10-CM | POA: Diagnosis present

## 2024-08-26 DIAGNOSIS — Z3A38 38 weeks gestation of pregnancy: Secondary | ICD-10-CM

## 2024-08-26 DIAGNOSIS — Z8249 Family history of ischemic heart disease and other diseases of the circulatory system: Secondary | ICD-10-CM

## 2024-08-26 DIAGNOSIS — O99214 Obesity complicating childbirth: Secondary | ICD-10-CM | POA: Diagnosis present

## 2024-08-26 DIAGNOSIS — Z7984 Long term (current) use of oral hypoglycemic drugs: Secondary | ICD-10-CM

## 2024-08-26 DIAGNOSIS — D509 Iron deficiency anemia, unspecified: Secondary | ICD-10-CM | POA: Diagnosis present

## 2024-08-26 DIAGNOSIS — Z7985 Long-term (current) use of injectable non-insulin antidiabetic drugs: Secondary | ICD-10-CM

## 2024-08-26 DIAGNOSIS — O9962 Diseases of the digestive system complicating childbirth: Secondary | ICD-10-CM | POA: Diagnosis present

## 2024-08-26 DIAGNOSIS — O99210 Obesity complicating pregnancy, unspecified trimester: Secondary | ICD-10-CM | POA: Diagnosis present

## 2024-08-26 DIAGNOSIS — E119 Type 2 diabetes mellitus without complications: Secondary | ICD-10-CM | POA: Diagnosis present

## 2024-08-26 DIAGNOSIS — O9902 Anemia complicating childbirth: Secondary | ICD-10-CM | POA: Diagnosis present

## 2024-08-26 DIAGNOSIS — Z833 Family history of diabetes mellitus: Secondary | ICD-10-CM

## 2024-08-26 DIAGNOSIS — O24111 Pre-existing diabetes mellitus, type 2, in pregnancy, first trimester: Secondary | ICD-10-CM

## 2024-08-26 DIAGNOSIS — O34211 Maternal care for low transverse scar from previous cesarean delivery: Secondary | ICD-10-CM | POA: Diagnosis present

## 2024-08-26 LAB — GLUCOSE, CAPILLARY: Glucose-Capillary: 86 mg/dL (ref 70–99)

## 2024-08-26 SURGERY — Surgical Case
Anesthesia: Spinal | Site: Abdomen

## 2024-08-26 MED ORDER — OXYCODONE HCL 5 MG PO TABS: 5.0000 mg | ORAL_TABLET | ORAL | Status: DC | PRN

## 2024-08-26 MED ORDER — MORPHINE SULFATE (PF) 0.5 MG/ML IJ SOLN: INTRAMUSCULAR | Status: DC | PRN

## 2024-08-26 MED ORDER — ENOXAPARIN SODIUM 40 MG/0.4ML IJ SOSY
40.0000 mg | PREFILLED_SYRINGE | INTRAMUSCULAR | Status: DC
  Administered 2024-08-27: 40 mg via SUBCUTANEOUS
  Filled 2024-08-26: qty 0.4

## 2024-08-26 MED ORDER — IBUPROFEN 600 MG PO TABS
600.0000 mg | ORAL_TABLET | Freq: Four times a day (QID) | ORAL | Status: DC
  Administered 2024-08-27: 600 mg via ORAL
  Filled 2024-08-26: qty 1

## 2024-08-26 MED ORDER — NALOXONE HCL 0.4 MG/ML IJ SOLN
0.4000 mg | INTRAMUSCULAR | Status: DC | PRN
Start: 2024-08-26 — End: 2024-08-27

## 2024-08-26 MED ORDER — SIMETHICONE 80 MG PO CHEW: 80.0000 mg | CHEWABLE_TABLET | ORAL | Status: DC | PRN

## 2024-08-26 MED ORDER — MEPERIDINE HCL 25 MG/ML IJ SOLN: 6.2500 mg | INTRAMUSCULAR | Status: DC | PRN

## 2024-08-26 MED ORDER — KETOROLAC TROMETHAMINE 30 MG/ML IJ SOLN: 30.0000 mg | Freq: Four times a day (QID) | INTRAMUSCULAR | Status: AC | PRN

## 2024-08-26 MED ORDER — MORPHINE SULFATE (PF) 0.5 MG/ML IJ SOLN
INTRAMUSCULAR | Status: DC | PRN
  Administered 2024-08-26: .1 mg via INTRATHECAL

## 2024-08-26 MED ORDER — BUPIVACAINE HCL (PF) 0.25 % IJ SOLN
INTRAMUSCULAR | Status: DC | PRN
  Administered 2024-08-26: 35 mL
  Administered 2024-08-26: 25 mL

## 2024-08-26 MED ORDER — LIDOCAINE HCL (PF) 1 % IJ SOLN
INTRAMUSCULAR | Status: DC | PRN
Start: 2024-08-26 — End: 2024-08-26
  Administered 2024-08-26: 3 mL

## 2024-08-26 MED ORDER — OXYCODONE HCL 5 MG PO TABS: 5.0000 mg | ORAL_TABLET | Freq: Four times a day (QID) | ORAL | Status: DC | PRN

## 2024-08-26 MED ORDER — DIPHENHYDRAMINE HCL 50 MG/ML IJ SOLN: 12.5000 mg | INTRAMUSCULAR | Status: DC | PRN

## 2024-08-26 MED ORDER — DIPHENHYDRAMINE HCL 25 MG PO CAPS: 25.0000 mg | ORAL_CAPSULE | ORAL | Status: DC | PRN

## 2024-08-26 MED ORDER — NALOXONE HCL 4 MG/10ML IJ SOLN: 1.0000 ug/kg/h | INTRAVENOUS | Status: DC | PRN

## 2024-08-26 MED ORDER — SOD CITRATE-CITRIC ACID 500-334 MG/5ML PO SOLN
30.0000 mL | ORAL | Status: AC
Start: 2024-08-26 — End: 2024-08-26
  Administered 2024-08-26: 30 mL via ORAL

## 2024-08-26 MED ORDER — COCONUT OIL OIL: 1.0000 | TOPICAL_OIL | Status: DC | PRN

## 2024-08-26 MED ORDER — METHYLERGONOVINE MALEATE 0.2 MG/ML IJ SOLN
INTRAMUSCULAR | Status: AC
  Filled 2024-08-26: qty 1

## 2024-08-26 MED ORDER — OXYTOCIN-SODIUM CHLORIDE 30-0.9 UT/500ML-% IV SOLN
INTRAVENOUS | Status: DC | PRN
  Administered 2024-08-26: 500 mL via INTRAVENOUS

## 2024-08-26 MED ORDER — BUPIVACAINE IN DEXTROSE 0.75-8.25 % IT SOLN
INTRATHECAL | Status: DC | PRN
  Administered 2024-08-26: 1.6 mL via INTRATHECAL

## 2024-08-26 MED ORDER — SIMETHICONE 80 MG PO CHEW
80.0000 mg | CHEWABLE_TABLET | Freq: Three times a day (TID) | ORAL | Status: DC
  Administered 2024-08-26 – 2024-08-27 (×3): 80 mg via ORAL
  Filled 2024-08-26 (×4): qty 1

## 2024-08-26 MED ORDER — MORPHINE SULFATE (PF) 2 MG/ML IV SOLN: 1.0000 mg | INTRAVENOUS | Status: DC | PRN

## 2024-08-26 MED ORDER — ACETAMINOPHEN 500 MG PO TABS
1000.0000 mg | ORAL_TABLET | Freq: Four times a day (QID) | ORAL | Status: DC
  Administered 2024-08-26 – 2024-08-27 (×5): 1000 mg via ORAL
  Filled 2024-08-26 (×5): qty 2

## 2024-08-26 MED ORDER — BUPIVACAINE HCL (PF) 0.25 % IJ SOLN
INTRAMUSCULAR | Status: AC
  Filled 2024-08-26: qty 30

## 2024-08-26 MED ORDER — SENNOSIDES-DOCUSATE SODIUM 8.6-50 MG PO TABS
2.0000 | ORAL_TABLET | Freq: Every day | ORAL | Status: DC
  Administered 2024-08-27: 2 via ORAL
  Filled 2024-08-26: qty 2

## 2024-08-26 MED ORDER — MENTHOL 3 MG MT LOZG: 1.0000 | LOZENGE | OROMUCOSAL | Status: DC | PRN

## 2024-08-26 MED ORDER — DEXAMETHASONE SOD PHOSPHATE PF 10 MG/ML IJ SOLN
INTRAMUSCULAR | Status: DC | PRN
  Administered 2024-08-26: 10 mg via INTRAVENOUS

## 2024-08-26 MED ORDER — METFORMIN HCL 500 MG PO TABS
1000.0000 mg | ORAL_TABLET | Freq: Two times a day (BID) | ORAL | Status: DC
  Administered 2024-08-26 – 2024-08-27 (×2): 1000 mg via ORAL
  Filled 2024-08-26 (×4): qty 2

## 2024-08-26 MED ORDER — PRENATAL MULTIVITAMIN CH: 1.0000 | ORAL_TABLET | Freq: Every day | ORAL | Status: DC

## 2024-08-26 MED ORDER — ZOLPIDEM TARTRATE 5 MG PO TABS: 5.0000 mg | ORAL_TABLET | Freq: Every evening | ORAL | Status: DC | PRN

## 2024-08-26 MED ORDER — GABAPENTIN 300 MG PO CAPS
300.0000 mg | ORAL_CAPSULE | Freq: Every day | ORAL | Status: DC
  Administered 2024-08-26: 300 mg via ORAL
  Filled 2024-08-26: qty 1

## 2024-08-26 MED ORDER — FENTANYL CITRATE (PF) 100 MCG/2ML IJ SOLN
INTRAMUSCULAR | Status: AC
  Filled 2024-08-26: qty 2

## 2024-08-26 MED ORDER — SODIUM CHLORIDE 0.9% FLUSH: 3.0000 mL | INTRAVENOUS | Status: DC | PRN

## 2024-08-26 MED ORDER — ONDANSETRON HCL 4 MG/2ML IJ SOLN
INTRAMUSCULAR | Status: DC | PRN
  Administered 2024-08-26: 4 mg via INTRAVENOUS

## 2024-08-26 MED ORDER — SOD CITRATE-CITRIC ACID 500-334 MG/5ML PO SOLN
ORAL | Status: AC
  Filled 2024-08-26: qty 15

## 2024-08-26 MED ORDER — MORPHINE SULFATE (PF) 0.5 MG/ML IJ SOLN
INTRAMUSCULAR | Status: AC
  Filled 2024-08-26: qty 10

## 2024-08-26 MED ORDER — FENTANYL CITRATE (PF) 100 MCG/2ML IJ SOLN: 25.0000 ug | INTRAMUSCULAR | Status: DC | PRN

## 2024-08-26 MED ORDER — BUPIVACAINE HCL (PF) 0.5 % IJ SOLN
INTRAMUSCULAR | Status: AC
  Filled 2024-08-26: qty 10

## 2024-08-26 MED ORDER — KETOROLAC TROMETHAMINE 30 MG/ML IJ SOLN
30.0000 mg | Freq: Four times a day (QID) | INTRAMUSCULAR | Status: AC
  Administered 2024-08-26 – 2024-08-27 (×4): 30 mg via INTRAVENOUS
  Filled 2024-08-26 (×4): qty 1

## 2024-08-26 MED ORDER — OXYTOCIN-SODIUM CHLORIDE 30-0.9 UT/500ML-% IV SOLN
2.5000 [IU]/h | INTRAVENOUS | Status: AC
  Administered 2024-08-26: 2.5 [IU]/h via INTRAVENOUS

## 2024-08-26 MED ORDER — SODIUM CHLORIDE 0.9 % IV SOLN: 12.5000 mg | INTRAVENOUS | Status: DC | PRN

## 2024-08-26 MED ORDER — FENTANYL CITRATE (PF) 100 MCG/2ML IJ SOLN
INTRAMUSCULAR | Status: DC | PRN
  Administered 2024-08-26: 15 ug via INTRATHECAL

## 2024-08-26 MED ORDER — PHENYLEPHRINE HCL-NACL 20-0.9 MG/250ML-% IV SOLN
INTRAVENOUS | Status: DC | PRN
  Administered 2024-08-26: 40 ug/min via INTRAVENOUS

## 2024-08-26 MED ORDER — METHYLERGONOVINE MALEATE 0.2 MG/ML IJ SOLN
INTRAMUSCULAR | Status: DC | PRN
  Administered 2024-08-26: .2 mg via INTRAMUSCULAR

## 2024-08-26 MED ORDER — KETOROLAC TROMETHAMINE 30 MG/ML IJ SOLN
INTRAMUSCULAR | Status: DC | PRN
Start: 2024-08-26 — End: 2024-08-26
  Administered 2024-08-26: 30 mg via INTRAVENOUS

## 2024-08-26 MED ORDER — DIPHENHYDRAMINE HCL 25 MG PO CAPS: 25.0000 mg | ORAL_CAPSULE | Freq: Four times a day (QID) | ORAL | Status: DC | PRN

## 2024-08-26 MED ORDER — OXYTOCIN-SODIUM CHLORIDE 30-0.9 UT/500ML-% IV SOLN
INTRAVENOUS | Status: AC
  Filled 2024-08-26: qty 500

## 2024-08-26 MED ORDER — PHENYLEPHRINE HCL-NACL 20-0.9 MG/250ML-% IV SOLN
INTRAVENOUS | Status: AC
Start: 2024-08-26 — End: 2024-08-26
  Filled 2024-08-26: qty 250

## 2024-08-26 MED ORDER — ONDANSETRON HCL 4 MG/2ML IJ SOLN
4.0000 mg | Freq: Three times a day (TID) | INTRAMUSCULAR | Status: DC | PRN
Start: 2024-08-26 — End: 2024-08-27

## 2024-08-26 SURGICAL SUPPLY — 33 items
CHLORAPREP W/TINT 26 (MISCELLANEOUS) ×1 IMPLANT
DRSG TELFA 3X8 NADH STRL (GAUZE/BANDAGES/DRESSINGS) ×1 IMPLANT
ELECT CAUTERY BLADE 6.4 (BLADE) ×1 IMPLANT
ELECTRODE REM PT RTRN 9FT ADLT (ELECTROSURGICAL) ×1 IMPLANT
GAUZE SPONGE 4X4 12PLY STRL (GAUZE/BANDAGES/DRESSINGS) ×1 IMPLANT
GLOVE BIOGEL PI IND STRL 7.0 (GLOVE) IMPLANT
GLOVE BIOGEL PI IND STRL 7.5 (GLOVE) IMPLANT
GLOVE SURG SYN 6.5 PF PI BL (GLOVE) IMPLANT
GLOVE SURG SYN 8.0 PF PI (GLOVE) ×1 IMPLANT
GOWN STRL REUS W/ TWL LRG LVL3 (GOWN DISPOSABLE) ×2 IMPLANT
GOWN STRL REUS W/ TWL XL LVL3 (GOWN DISPOSABLE) ×1 IMPLANT
MANIFOLD NEPTUNE II (INSTRUMENTS) ×1 IMPLANT
MAT PREVALON FULL STRYKER (MISCELLANEOUS) ×1 IMPLANT
NDL HYPO 22X1.5 SAFETY MO (MISCELLANEOUS) ×1 IMPLANT
NEEDLE HYPO 22X1.5 SAFETY MO (MISCELLANEOUS) ×1 IMPLANT
PACK C SECTION AR (MISCELLANEOUS) ×1 IMPLANT
PAD DRESSING TELFA 3X8 NADH (GAUZE/BANDAGES/DRESSINGS) IMPLANT
PAD OB MATERNITY 11 LF (PERSONAL CARE ITEMS) ×1 IMPLANT
PAD PREP OB/GYN DISP 24X41 (PERSONAL CARE ITEMS) ×1 IMPLANT
SCRUB CHG 4% DYNA-HEX 4OZ (MISCELLANEOUS) ×1 IMPLANT
SOLN 0.9% NACL POUR BTL 1000ML (IV SOLUTION) ×1 IMPLANT
STAPLER INSORB 30 2030 C-SECTI (MISCELLANEOUS) IMPLANT
STRAP SAFETY 5IN WIDE (MISCELLANEOUS) ×1 IMPLANT
SUCT VACUUM KIWI BELL (SUCTIONS) IMPLANT
SUT CHROMIC 1 CTX 36 (SUTURE) ×3 IMPLANT
SUT CHROMIC 2 0 CT 1 (SUTURE) IMPLANT
SUT PLAIN GUT 0 (SUTURE) ×2 IMPLANT
SUT VIC AB 0 CT1 36 (SUTURE) ×2 IMPLANT
SUT VIC AB 2-0 SH 27XBRD (SUTURE) IMPLANT
SYR 30ML LL (SYRINGE) ×2 IMPLANT
TAPE PAPER 3X10 WHT MICROPORE (GAUZE/BANDAGES/DRESSINGS) IMPLANT
TRAP FLUID SMOKE EVACUATOR (MISCELLANEOUS) ×1 IMPLANT
WATER STERILE IRR 500ML POUR (IV SOLUTION) ×1 IMPLANT

## 2024-08-26 NOTE — Anesthesia Preprocedure Evaluation (Signed)
 Anesthesia Evaluation  Patient identified by MRN, date of birth, ID band Patient awake    Reviewed: Allergy & Precautions, NPO status , Patient's Chart, lab work & pertinent test results  History of Anesthesia Complications (+) PONVNegative for: history of anesthetic complications  Airway Mallampati: III  TM Distance: >3 FB Neck ROM: Full    Dental no notable dental hx. (+) Dental Advidsory Given   Pulmonary neg pulmonary ROS, neg shortness of breath, neg sleep apnea, neg COPD, neg recent URI   breath sounds clear to auscultation- rhonchi (-) wheezing      Cardiovascular Exercise Tolerance: Good (-) hypertension(-) angina (-) CAD and (-) Past MI (-) dysrhythmias  Rhythm:Regular Rate:Normal - Systolic murmurs and - Diastolic murmurs    Neuro/Psych negative neurological ROS  negative psych ROS   GI/Hepatic Neg liver ROS,GERD  ,,  Endo/Other  diabetes  Class 3 obesity  Renal/GU Renal disease (hx of nephrolithiasis)     Musculoskeletal negative musculoskeletal ROS (+)    Abdominal  (+) + obeseGravid abdomen  Peds  Hematology negative hematology ROS (+)   Anesthesia Other Findings Past Medical History: No date: Diabetes mellitus without complication (HCC) No date: History of kidney stones 2012: Kidney stone No date: Menorrhagia No date: Polycystic ovarian disease No date: PONV (postoperative nausea and vomiting)     Comment:  vomited x2 in OR during previous C-section No date: UTI (lower urinary tract infection)   Reproductive/Obstetrics (+) Pregnancy                              Anesthesia Physical Anesthesia Plan  ASA: 3  Anesthesia Plan: Spinal   Post-op Pain Management:    Induction:   PONV Risk Score and Plan:   Airway Management Planned: Natural Airway  Additional Equipment:   Intra-op Plan:   Post-operative Plan:   Informed Consent: I have reviewed the patients  History and Physical, chart, labs and discussed the procedure including the risks, benefits and alternatives for the proposed anesthesia with the patient or authorized representative who has indicated his/her understanding and acceptance.       Plan Discussed with: Anesthesiologist  Anesthesia Plan Comments:          Lab Results  Component Value Date   WBC 7.4 08/25/2024   HGB 9.7 (L) 08/25/2024   HCT 30.6 (L) 08/25/2024   MCV 78.3 (L) 08/25/2024   PLT 285 08/25/2024    Anesthesia Quick Evaluation

## 2024-08-26 NOTE — Progress Notes (Signed)
 38+0 Type II dm  Elective c/s . Labs reviewed . All questions answered Proceed

## 2024-08-26 NOTE — Lactation Note (Signed)
 This note was copied from a baby's chart. Lactation Consultation Note  Patient Name: [name] Unijb'd Date: 08/26/2024 Age:33 hours Reason for consult: Initial assessment;1st time breastfeeding;Early term 37-38.6wks   Maternal Data Does the patient have breastfeeding experience prior to this delivery?: No  Initial assessment  w/ a 6hr old baby Charlene Tillie Mae and mom.  This is moms 3rds baby.  Delivery is a elective, repeat c-section.  Patient w/ a hx of diabetes mellitus type II (on Metformin , Lantus, and Mounjaro), anemic and subchorionic hemorrhage (resolved May 2025).  MOB verbalized that she didn't breastfeed her other children, but this infant latches easily.    Feeding Mother's Current Feeding Choice: Breast Milk  Several breastfeeding attempts were made on the left breast. Infant very fussy at the breast, LC had patient change positioning to the rt breast, where infant latched and nursed for about 10 minutes.    Patient will need continued practice of positioning of infant on the breast.  LATCH Score Latch: Repeated attempts needed to sustain latch, nipple held in mouth throughout feeding, stimulation needed to elicit sucking reflex.  Audible Swallowing: A few with stimulation  Type of Nipple: Everted at rest and after stimulation  Comfort (Breast/Nipple): Soft / non-tender  Hold (Positioning): No assistance needed to correctly position infant at breast.  LATCH Score: 8  Interventions Interventions: Breast feeding basics reviewed;Assisted with latch;Skin to skin;Breast compression;Adjust position;Support pillows;Position options;Education  LC provided education on the following;  milk production expectations, hunger cues, day 1/2 wet/dirty diapers, hand expression,  benefits of STS and arousing infant for a feeding.  Lactation informed patient of feeding infant at least 8 or more times w/in a 24hr period but not exceeding 3hrs. Patient verbalized  understanding.   Consult Status Consult Status: Follow-up Follow-up type: In-patient    Charita Lindenberger S Montravious Weigelt 08/26/2024, 3:43 PM

## 2024-08-26 NOTE — Op Note (Signed)
 [name], [name] MEDICAL RECORD NO: [id number] ACCOUNT NO: [id number] DATE OF BIRTH: 19-Jul-1991 FACILITY: [facility] LOCATION: [facility] PHYSICIAN: Debby DOROTHA Dinsmore, MD  Operative Report   PREOPERATIVE DIAGNOSES: 1.  38 plus 0 weeks estimated gestational age. 2.  Gestational diabetic, type 2 diabetes.  POSTOPERATIVE DIAGNOSES: 1.  38 plus 0 weeks estimated gestational age. 2.  Gestational diabetic, type 2 diabetes. 3.  Elective repeat cesarean section. 4.  Vigorous female delivered.  PROCEDURE:  Elective repeat low transverse cesarean section.  SURGEON:  Debby DOROTHA Dinsmore, MD  FIRST ASSISTANT:  Certified nurse midwife, Vernel.  ANESTHESIA:  Spinal.  INDICATIONS:  This is a 33 year old gravida 5, para 2 patient at 2 plus 0 weeks estimated gestational age with type 2 diabetes, suboptimal control.  The patient has elected for a repeat cesarean section.  DESCRIPTION OF PROCEDURE:  After adequate spinal anesthesia, the patient was placed in the dorsal supine position and hip roll on the right side.  The patient's abdomen and vagina were prepped and draped in normal sterile fashion.  The patient did  receive 2 g of IV Ancef  prior to surgical commencement.  A timeout was performed.  A Pfannenstiel incision was made 2 fingerbreadths above the symphysis pubis.  Sharp dissection was used to identify the fascia.  The fascia was opened in the midline and  opened in a transverse fashion.  The superior aspect of the fascia was grasped with Kocher clamps and the recti muscles were dissected free.  The inferior aspect of the fascia was grasped with Kocher clamps and the pyramidalis muscle was dissected free.   Entry into the peritoneal cavity was accomplished sharply.  There were several uterine adhesions that were taken down sharply and ligated with 0 Vicryl suture.  The vesicouterine peritoneal fold was identified and opened and the bladder was reflected  inferiorly.  A low transverse  uterine incision was made.  Upon entry into the endometrial cavity, clear fluid resulted.  The incision was extended with blunt transverse traction.  A large fetal head was brought to the incision and a Kiwi vacuum was  placed on the occiput and one gentle pull with fundal pressure allowed for the delivery of the fetal head.  Certified nurse midwife, Mackie's expertise was needed for retraction and for fundal pressure for delivery of the infant.  Shoulders and body were  delivered without difficulty and vigorous female was then dried on the mother's abdomen for 60 seconds and passed to the nursery staff who assigned Apgar scores of 8 and 8.  The placenta was manually delivered and the uterus was exteriorized and the  endometrial cavity was wiped clean with laparotomy tape.  The uterine incision was then closed with one chromic suture in a running locking fashion, good approximation of the edges.  Two additional figure-of-eight sutures were required for good  hemostasis.  Fallopian tubes and ovaries appeared normal.  Uterus was a little atonic.  Therefore, she received 0.2 mg intramuscular Methergine  during the procedure while the IV Pitocin  was administered.  The posterior cul-de-sac was irrigated and  suctioned and the uterus was placed back into the abdominal cavity and the paracolic gutters were wiped clean with laparotomy tape.  Uterine incision again appeared hemostatic.  Fascia was then closed with 0 Vicryl suture in a running non-locking  fashion.  Two separate sutures were used.  Fascial edges were then injected with a solution of 0.25% Marcaine .  30 mL of Marcaine  was injected at the fascial edges.  Subcutaneous tissues  were irrigated and Bovied and given the subcutaneous depth of 3.5  cm, the dead space was closed with a 2-0 chromic suture.  Skin was reapproximated with Insorb absorbable staples and additional 20 mL of Marcaine  was injected beneath the skin.  QUANTITATIVE BLOOD LOSS:  620  mL  INTRAOPERATIVE FLUIDS:  1000 mL  URINE OUTPUT:  50 mL.  The patient did receive 30 mg of Toradol  at the end of the procedure.  There were no complications.  She was taken to the recovery room in good condition.   SHW D: 08/26/2024 9:17:23 am T: 08/26/2024 9:53:00 am  JOB: [id number]/ [id number]

## 2024-08-26 NOTE — Discharge Summary (Signed)
 Obstetrical Discharge Summary  Patient Name: [name] DOB: [birth year] MRN: [id number]  Date of Admission: 08/26/2024 Date of Delivery:08/26/24 Delivered by: ONEIDA Dinsmore MD Date of Discharge: 08/27/2024   Primary [facility] OFE:Ejupzwu'd last menstrual period was 12/04/2023. EDC Estimated Date of Delivery: 09/09/24 Gestational Age at Delivery: [redacted]w[redacted]d   Antepartum complications: Type 2 DM  Admitting Diagnosis: elective repeat  Secondary Diagnosis: Patient Active Problem List   Diagnosis Date Noted   Previous cesarean delivery affecting pregnancy 08/26/2024   Postoperative state 08/26/2024   Vaginal discharge during pregnancy in second trimester 06/07/2024   Abnormal brain MRI 03/20/2024   Obesity in pregnancy, antepartum 02/13/2024   Supervision of high risk pregnancy in second trimester 07/15/2023   Family history of factor V Leiden mutation 10/25/2022   Vitamin D deficiency 10/25/2022   Pre-existing type 2 diabetes mellitus during pregnancy with postpartum complication, delivered 01/03/2021   Diabetes mellitus, new onset (HCC) 12/15/2013   Screening for ischemic heart disease 12/15/2013    Augmentation: N/A Complications: None Intrapartum complications/course: Scheduled repeat cesarean section. Please see OP note for further details  Date of Delivery: 08/26/2024 Delivered By: IVAR Dinsmore MD Delivery Type: repeat cesarean section, low transverse incision Anesthesia:spinal Placenta: manual Laceration: None Episiotomy: none Newborn Data: Live born female  Birth Weight: 8 lb 7.1 oz (3830 g) APGAR: 8, 8  Newborn Delivery   Birth date/time: 08/26/2024 08:23:00 Delivery type: C-Section, Low Transverse Trial of labor: No C-section categorization: Repeat    Postpartum Procedures: IV Venofer  Edinburgh:     08/26/2024   11:30 PM 08/26/2024    8:00 PM 07/08/2021    8:00 PM  Edinburgh Postnatal Depression Scale Screening Tool  I have been able to  laugh and see the funny side of things. 0 -- 0   I have looked forward with enjoyment to things. 0  0   I have blamed myself unnecessarily when things went wrong. 0  0   I have been anxious or worried for no good reason. 0  0   I have felt scared or panicky for no good reason. 0  0   Things have been getting on top of me. 0  0   I have been so unhappy that I have had difficulty sleeping. 0  0   I have felt sad or miserable. 0  0   I have been so unhappy that I have been crying. 0  0   The thought of harming myself has occurred to me. 0  0   Edinburgh Postnatal Depression Scale Total 0  0      Data saved with a previous flowsheet row definition    Post partum course:  Patient had an uncomplicated postpartum course.  By time of discharge on POD#1, her pain was controlled on oral pain medications; she had appropriate lochia and was ambulating, voiding without difficulty, tolerating regular diet and passing flatus.   She was deemed stable for discharge to home.    Discharge Physical Exam:  BP 110/60 (BP Location: Left Arm)   Pulse 80   Temp 98 F (36.7 C) (Oral)   Resp 18   LMP 12/04/2023   SpO2 97%   Breastfeeding Unknown   General: NAD CV: RRR Pulm: CTABL, nl effort ABD: s/nd/nt, fundus firm and below the umbilicus Lochia: moderate Incision: c/d/i DVT Evaluation: LE non-ttp, no evidence of DVT on exam.  Hemoglobin  Date Value Ref Range Status  08/27/2024 8.9 (L) 12.0 - 15.0 g/dL  Final  10/20/2016 11.0 g/dL Final   HCT  Date Value Ref Range Status  08/27/2024 27.8 (L) 36.0 - 46.0 % Final  10/20/2016 34 % Final     Disposition: stable, discharge to home. Baby Feeding: breastmilk and formula Baby Disposition: home with mom  Rh Immune globulin given: Rh pos  Rubella vaccine given: Not indicated  Tdap vaccine given in AP or PP setting: given AP Flu vaccine given in AP or PP setting: Given AP  Contraception: TBD  Recommend 6 weeks of prophylactic anticoagulation  with LMWH or subcutaneous unfractionated heparin if 1 or more high risk factor is present.  Recommend 14 days of prophylactic anticoagulation with LMWH or subcutaneous unfractionated heparin if 3 or more moderate risk factors are present.   Risk assessment for postpartum VTE and prophylactic treatment:  High risk factors: None Moderate risk factors: Cesarean delivery   Postpartum VTE prophylaxis with LMWH not indicated    Prenatal Labs:   ABO, Rh:  B+ Antibody:  neg Rubella:  Imm , VZ Imm RPR:   NR  HBsAg:   neg , hep C NR HIV:   neg GBS: Positive  Plan:  [name] was discharged to home in good condition. Follow-up appointment with delivering provider in 2 weeks.  Discharge Medications: Allergies as of 08/27/2024   No Known Allergies      Medication List     STOP taking these medications    Basaglar KwikPen 100 UNIT/ML   folic acid 1 MG tablet Commonly known as: FOLVITE   insulin  lispro 100 UNIT/ML KwikPen Commonly known as: HUMALOG       TAKE these medications    acetaminophen  500 MG tablet Commonly known as: TYLENOL  Take 2 tablets (1,000 mg total) by mouth every 6 (six) hours.   ibuprofen  600 MG tablet Commonly known as: ADVIL  Take 1 tablet (600 mg total) by mouth every 6 (six) hours as needed for moderate pain (pain score 4-6), mild pain (pain score 1-3) or cramping.   metFORMIN  1000 MG tablet Commonly known as: GLUCOPHAGE  Take 1 tablet (1,000 mg total) by mouth 2 (two) times daily with a meal. Breakfast & supper What changed: medication strength   multivitamin-prenatal 27-0.8 MG Tabs tablet Take 1 tablet by mouth in the morning.   oxyCODONE  5 MG immediate release tablet Commonly known as: Oxy IR/ROXICODONE  Take 1 tablet (5 mg total) by mouth every 6 (six) hours as needed for up to 5 days for severe pain (pain score 7-10) or breakthrough pain.   senna-docusate 8.6-50 MG tablet Commonly known as: Senokot-S Take 2 tablets by mouth daily as  needed for mild constipation.         Follow-up Information     [facility]. Schedule an appointment as soon as possible for a visit today.   Why: incision check Contact information: [address] [place]  [phone]                Signed:  Therisa Pillow, CNM Certified Nurse Midwife [facility]

## 2024-08-26 NOTE — Transfer of Care (Signed)
 Immediate Anesthesia Transfer of Care Note  Patient: [name]  Procedure(s) Performed: CESAREAN DELIVERY (Abdomen)  Patient Location: Mother/Baby  Anesthesia Type:Spinal  Level of Consciousness: awake, alert , and oriented  Airway & Oxygen Therapy: Patient Spontanous Breathing  Post-op Assessment: Report given to RN and Post -op Vital signs reviewed and stable  Post vital signs: Reviewed and stable  Last Vitals:  Vitals Value Taken Time  BP 112/61   Temp    Pulse 77   Resp 13   SpO2 97     Last Pain: There were no vitals filed for this visit.       Complications: No notable events documented.

## 2024-08-26 NOTE — Brief Op Note (Signed)
 Brief op  Preop 38+[redacted] weeks EGa   Elective cesarean section POst Op : same   Add vigorous female  Procedure : LTCS repeat   Surgeon : Troye Hiemstra Debby Assist CNM MAckie  Anesthesia : spinal   QBL 620 cc  IOL 1000cc Uo 50 cc No complications

## 2024-08-26 NOTE — Anesthesia Procedure Notes (Signed)
 Spinal  Patient location during procedure: OR Start time: 08/26/2024 7:49 AM End time: 08/26/2024 7:50 AM Reason for block: surgical anesthesia Staffing Performed: resident/CRNA  Anesthesiologist: Dario Barter, MD Resident/CRNA: Belinda Salm, CRNA Performed by: Belinda, Retha Bither, CRNA Authorized by: Dario Barter, MD   Preanesthetic Checklist Completed: patient identified, IV checked, site marked, risks and benefits discussed, surgical consent, monitors and equipment checked and pre-op evaluation Spinal Block Patient position: sitting Prep: ChloraPrep Patient monitoring: heart rate, continuous pulse ox and blood pressure Approach: midline Location: L3-4 Injection technique: single-shot Needle Needle type: Pencan  Needle gauge: 24 G Needle length: 10 cm Assessment Events: CSF return Additional Notes IV functioning, monitors applied to pt. Expiration date of kit checked and confirmed to be in date. Sterile prep and drape, hand hygiene and sterile gloved used. Pt was positioned and spine was prepped in sterile fashion. Skin was anesthetized with lidocaine . Free flow of clear CSF obtained prior to injecting local anesthetic into CSF x 1 attempt. Spinal needle aspirated freely following injection. Needle was carefully withdrawn, and pt tolerated procedure well. Loss of motor and sensory on exam post injection.

## 2024-08-27 ENCOUNTER — Encounter: Payer: Self-pay | Admitting: Obstetrics and Gynecology

## 2024-08-27 LAB — CBC
HCT: 27.8 % — ABNORMAL LOW (ref 36.0–46.0)
Hemoglobin: 8.9 g/dL — ABNORMAL LOW (ref 12.0–15.0)
MCH: 25 pg — ABNORMAL LOW (ref 26.0–34.0)
MCHC: 32 g/dL (ref 30.0–36.0)
MCV: 78.1 fL — ABNORMAL LOW (ref 80.0–100.0)
Platelets: 258 K/uL (ref 150–400)
RBC: 3.56 MIL/uL — ABNORMAL LOW (ref 3.87–5.11)
RDW: 13 % (ref 11.5–15.5)
WBC: 9 K/uL (ref 4.0–10.5)
nRBC: 0.2 % (ref 0.0–0.2)

## 2024-08-27 LAB — GLUCOSE, CAPILLARY: Glucose-Capillary: 85 mg/dL (ref 70–99)

## 2024-08-27 MED ORDER — SODIUM CHLORIDE 0.9 % IV SOLN: INTRAVENOUS | Status: DC | PRN

## 2024-08-27 MED ORDER — EPINEPHRINE 0.3 MG/0.3ML IJ SOAJ: 0.3000 mg | Freq: Once | INTRAMUSCULAR | Status: DC | PRN

## 2024-08-27 MED ORDER — SENNOSIDES-DOCUSATE SODIUM 8.6-50 MG PO TABS: 2.0000 | ORAL_TABLET | Freq: Every day | ORAL | Status: AC | PRN

## 2024-08-27 MED ORDER — ACETAMINOPHEN 500 MG PO TABS: 1000.0000 mg | ORAL_TABLET | Freq: Four times a day (QID) | ORAL | Status: AC

## 2024-08-27 MED ORDER — SODIUM CHLORIDE 0.9 % IV BOLUS: 500.0000 mL | Freq: Once | INTRAVENOUS | Status: DC | PRN

## 2024-08-27 MED ORDER — ALBUTEROL SULFATE (2.5 MG/3ML) 0.083% IN NEBU: 2.5000 mg | INHALATION_SOLUTION | Freq: Once | RESPIRATORY_TRACT | Status: DC | PRN

## 2024-08-27 MED ORDER — IBUPROFEN 600 MG PO TABS: 600.0000 mg | ORAL_TABLET | Freq: Four times a day (QID) | ORAL | 1 refills | Status: AC | PRN

## 2024-08-27 MED ORDER — IRON SUCROSE 300 MG IVPB - SIMPLE MED
300.0000 mg | Freq: Once | Status: AC
  Administered 2024-08-27: 300 mg via INTRAVENOUS
  Filled 2024-08-27: qty 300

## 2024-08-27 MED ORDER — METFORMIN HCL 1000 MG PO TABS: 1000.0000 mg | ORAL_TABLET | Freq: Two times a day (BID) | ORAL | 1 refills | Status: AC

## 2024-08-27 MED ORDER — METHYLPREDNISOLONE SODIUM SUCC 125 MG IJ SOLR: 125.0000 mg | Freq: Once | INTRAMUSCULAR | Status: DC | PRN

## 2024-08-27 MED ORDER — OXYCODONE HCL 5 MG PO TABS: 5.0000 mg | ORAL_TABLET | Freq: Four times a day (QID) | ORAL | 0 refills | Status: AC | PRN

## 2024-08-27 NOTE — Anesthesia Postprocedure Evaluation (Signed)
 Anesthesia Post Note  Patient: [name]  Procedure(s) Performed: CESAREAN DELIVERY (Abdomen)  Patient location during evaluation: Mother Baby Anesthesia Type: Spinal Pain management: pain level controlled Vital Signs Assessment: post-procedure vital signs reviewed and stable Respiratory status: respiratory function stable Cardiovascular status: stable Postop Assessment: no headache, no backache, able to ambulate, adequate PO intake, patient able to bend at knees and no apparent nausea or vomiting Anesthetic complications: no   No notable events documented.   Last Vitals:  Vitals:   08/27/24 0700 08/27/24 0828  BP:  110/60  Pulse: 86 80  Resp:  18  Temp:  36.7 C  SpO2: 96% 97%    Last Pain:  Vitals:   08/27/24 0828  TempSrc: Oral  PainSc:                  Lorriane Romero FALCON

## 2024-08-27 NOTE — Discharge Instructions (Addendum)
 Cesarean Delivery, Care After Refer to this sheet in the next few weeks. These instructions provide you with information on caring for yourself after your procedure. Your health care provider may also give you specific instructions. Your treatment has been planned according to current medical practices, but problems sometimes occur. Call your health care provider if you have any problems or questions after you go home. HOME CARE INSTRUCTIONS  Please leave honey comb dressing (OP Site) on for 7 days.  You may shower during this period but turn your back to the water so that the dressing does not get directly saturated by the water.   You may take the dressing off on day 7.  The easiest way to do it is in the shower.  Allow the water to run over the dressing and it usually comes off easier.   Only take over-the-counter or prescription medications as directed by your health care provider. Do not drink alcohol, especially if you are breastfeeding or taking medication to relieve pain. Do not  smoke tobacco. Continue to use good perineal care. Good perineal care includes: Wiping your perineum from front to back. Keeping your perineum clean. Check your surgical cut (incision) daily for increased redness, drainage, swelling, or separation of skin. Shower and clean your incision gently with soap and water every day, by letting warm and soapy water run over the incision, and then pat it dry. If your health care provider says it is okay, leave the incision uncovered. Use a bandage (dressing) if the incision is draining fluid or appears irritated. If the adhesive strips across the incision do not fall off within 7 days, carefully peel them off, after a shower. Hug a pillow when coughing or sneezing until your incision is healed. This helps to relieve pain. Do not use tampons, douches or have sexual intercourse, until your health care provider says it is okay. Wear a well-fitting bra that provides breast  support. Limit wearing support panties or control-top hose. Drink enough fluids to keep your urine clear or pale yellow. Eat high-fiber foods such as whole grain cereals and breads, brown rice, beans, and fresh fruits and vegetables every day. These foods may help prevent or relieve constipation. Resume activities such as climbing stairs, driving, lifting, exercising, or traveling as directed by your health care provider. Try to have someone help you with your household activities and your newborn for at least a few days after you leave the hospital. Rest as much as possible. Try to rest or take a nap when your newborn is sleeping. Increase your activities gradually. Do not lift more than 15lbs until directed by a provider. Keep all of your scheduled postpartum appointments. It is very important to keep your scheduled follow-up appointments. At these appointments, your health care provider will be checking to make sure that you are healing physically and emotionally. SEEK MEDICAL CARE IF:  You are passing large clots from your vagina. Save any clots to show your health care provider. You have a foul smelling discharge from your vagina. You have trouble urinating. You are urinating frequently. You have pain when you urinate. You have a change in your bowel movements. You have increasing redness, pain, or swelling near your incision. You have pus draining from your incision. Your incision is separating. You have painful, hard, or reddened breasts. You have a severe headache. You have blurred vision or see spots. You feel sad or depressed. You have thoughts of hurting yourself or your newborn. You have questions  about your care, the care of your newborn, or medications. You are dizzy or light-headed. You have a rash. You have pain, redness, or swelling at the site of the removed intravenous access (IV) tube. You have nausea or vomiting. You stopped breastfeeding and have not had a  menstrual period within 12 weeks of stopping. You are not breastfeeding and have not had a menstrual period within 12 weeks of delivery. You have a fever. SEEK IMMEDIATE MEDICAL CARE IF: You have persistent pain. You have chest pain. You have shortness of breath. You faint. You have leg pain. You have stomach pain. Your vaginal bleeding saturates 2 or more sanitary pads in 1 hour. MAKE SURE YOU:  Understand these instructions. Will watch your condition. Will get help right away if you are not doing well or get worse. Document Released: 07/01/2002 Document Revised: 02/23/2014 Document Reviewed: 06/05/2012 [facility] Patient Information 2015 [place], [place]. This information is not intended to replace advice given to you by your health care provider. Make sure you discuss any questions you have with your health care provider.   Call office if you have any of the following:  -Persistent headache or visual changes (possible high blood pressure) -Fever >101.0 F or chills (possible infection) -Breast concerns (engorgement, mastitis) -Excessive vaginal bleeding (soaking through more than one pad in 1 hr x 2 hr) -Incision drainage/redness/increased pain/warmth at site (possible infection)  -Leg pain or redness (possible blood clot) -Depression/anxiety increased symptoms 2 weeks after delivery  Activity & Hygiene: -Do not lift > 10 lbs for 6 weeks.  -No intercourse or tampons for 6 weeks.  -No swimming pools, hot tubs or tub baths- showers only for 6 weeks **No driving for 1-2 weeks or while taking pain medication after c-section  -It is normal to bleed for up to 6 weeks. You should not soak through more than 1 pad in 1 hour x 2 hours.  Breastfeeding: -Continue prenatal vitamin.  -Increase calories and fluids.  -Your milk will come in, in the next couple of days (right now it is colostrum).  -You may have a slight fever when your milk comes in, but it should go away on its own.   -If it  does not, and rises above 101 F please call the doctor.  -You will also feel achy and your breasts will be firm. They will also start to leak.  *For breastfeeding concerns, the lactation consultant can be reached at [phone]  Not Breastfeeding: -Avoid breast stimulation and wear supportive bra -ICE helps decrease inflammation and pain -Express milk for comfort by hand, do not empty breast  Postpartum blues: -feelings of happy one minute and sad another minute are normal for the first few weeks. -if it gets worse please let your doctor know. It is very common!!

## 2024-08-27 NOTE — Progress Notes (Addendum)
 Postop Day  1  Subjective: 33 y.o. H4E6976 postpartum day #1 status post repeat cesarean section. She is ambulating, is tolerating po, is voiding spontaneously.  Her pain is well controlled on PO pain medications. Her lochia is less than menses.  Objective: BP 110/60 (BP Location: Left Arm)   Pulse 80   Temp 98 F (36.7 C) (Oral)   Resp 18   LMP 12/04/2023   SpO2 97%   Breastfeeding Unknown    Physical Exam:  General: alert, cooperative, and no distress Breasts: soft/nontender Pulm: nl effort Abdomen: soft, non-tender, active bowel sounds Uterine Fundus: firm Incision: no significant drainage, covered with pressure dressing Perineum: minimal edema, intact Lochia: appropriate DVT Evaluation: No evidence of DVT seen on physical exam.  Recent Labs    08/25/24 0958 08/27/24 0446  HGB 9.7* 8.9*  HCT 30.6* 27.8*  WBC 7.4 9.0  PLT 285 258   CBG (last 3)  Recent Labs    08/26/24 0553 08/27/24 0522  GLUCAP 86 85     Assessment/Plan: 33 y.o. [id number] postpartum day # 1  1. Continue routine postpartum care - up to shower today  - May remove pressure dressing and replace with OP site dressing for 7 days   2. Infant feeding status: breast and formula feeding -Lactation consult PRN for breastfeeding   3. Contraception plan: TBD  4. Maternal iron deficiency anemia of pregnancy - clinically significant.  -Admission hgb 9.4 with repeat postpartum 8.9 -Hemodynamically stable and asymptomatic -Intervention: IV iron transfusion with venofer ordered   5. Immunization status:   all immunizations up to date  6. Type II DM  - Insulin  discontinued - Metformin  1000 mg BID started  - CBG within range postpartum  - Follow up with endocrinology scheduled   Disposition: continue inpatient postpartum care , desires discharge home today    LOS: 1 day   Therisa CHRISTELLA Pillow, CNM 08/27/2024, 9:04 AM   ----- Therisa Pillow  Certified Nurse Midwife [facility]

## 2024-08-27 NOTE — Lactation Note (Signed)
 This note was copied from a baby's chart. Lactation Consultation Note  Patient Name: [name] Unijb'd Date: 08/27/2024 Age:33 hours Reason for consult: Follow-up assessment;Early term 53-38.6wks   Maternal Data  Follow-up assessment with 72 hour old baby Charlene and parents. Mom states feeding is going well and just finish feeding baby 10 ml of formula upon entry of lactation consultant. Mom states she latched baby for a minute on right breast.   Feeding Mother's Current Feeding Choice: Breast Milk and Formula Nipple Type: Slow - flow  Lactation Tools Discussed/Used  Lactation provide 18 mm size flanges after mom stated she uses 19 mm size flanges at home.  Interventions Interventions: Education Lactation spoke on breast milk storage guidelines and provide CDC guidelines.  Consult Status Consult Status: Follow-up Follow-up type: In-patient   Cezar Misiaszek 08/27/2024, 11:04 AM

## 2024-11-07 ENCOUNTER — Encounter: Payer: Self-pay | Admitting: Emergency Medicine

## 2024-11-07 ENCOUNTER — Other Ambulatory Visit: Payer: Self-pay

## 2024-11-07 ENCOUNTER — Emergency Department: Admission: EM | Admit: 2024-11-07 | Discharge: 2024-11-07 | Disposition: A | Discharge status: Home / Self Care

## 2024-11-07 DIAGNOSIS — R1031 Right lower quadrant pain: Secondary | ICD-10-CM | POA: Diagnosis not present

## 2024-11-07 DIAGNOSIS — R1030 Lower abdominal pain, unspecified: Secondary | ICD-10-CM

## 2024-11-07 DIAGNOSIS — R309 Painful micturition, unspecified: Secondary | ICD-10-CM | POA: Insufficient documentation

## 2024-11-07 DIAGNOSIS — M545 Low back pain, unspecified: Secondary | ICD-10-CM | POA: Insufficient documentation

## 2024-11-07 LAB — CBC WITH DIFFERENTIAL/PLATELET
Abs Immature Granulocytes: 0.03 K/uL (ref 0.00–0.07)
Basophils Absolute: 0.1 K/uL (ref 0.0–0.1)
Basophils Relative: 1 %
Eosinophils Absolute: 0.4 K/uL (ref 0.0–0.5)
Eosinophils Relative: 5 %
HCT: 39.6 % (ref 36.0–46.0)
Hemoglobin: 12.3 g/dL (ref 12.0–15.0)
Immature Granulocytes: 0 %
Lymphocytes Relative: 34 %
Lymphs Abs: 3 K/uL (ref 0.7–4.0)
MCH: 26.1 pg (ref 26.0–34.0)
MCHC: 31.1 g/dL (ref 30.0–36.0)
MCV: 83.9 fL (ref 80.0–100.0)
Monocytes Absolute: 0.4 K/uL (ref 0.1–1.0)
Monocytes Relative: 5 %
Neutro Abs: 4.9 K/uL (ref 1.7–7.7)
Neutrophils Relative %: 55 %
Platelets: 285 K/uL (ref 150–400)
RBC: 4.72 MIL/uL (ref 3.87–5.11)
RDW: 16.8 % — ABNORMAL HIGH (ref 11.5–15.5)
WBC: 8.8 K/uL (ref 4.0–10.5)
nRBC: 0 % (ref 0.0–0.2)

## 2024-11-07 LAB — COMPREHENSIVE METABOLIC PANEL WITH GFR
ALT: 75 U/L — ABNORMAL HIGH (ref 0–44)
AST: 34 U/L (ref 15–41)
Albumin: 4.5 g/dL (ref 3.5–5.0)
Alkaline Phosphatase: 62 U/L (ref 38–126)
Anion gap: 13 (ref 5–15)
BUN: 14 mg/dL (ref 6–20)
CO2: 22 mmol/L (ref 22–32)
Calcium: 9.5 mg/dL (ref 8.9–10.3)
Chloride: 105 mmol/L (ref 98–111)
Creatinine, Ser: 0.53 mg/dL (ref 0.44–1.00)
GFR, Estimated: >60 mL/min
Glucose, Bld: 148 mg/dL — ABNORMAL HIGH (ref 70–99)
Potassium: 4.3 mmol/L (ref 3.5–5.1)
Sodium: 140 mmol/L (ref 135–145)
Total Bilirubin: 0.2 mg/dL (ref 0.0–1.2)
Total Protein: 7.2 g/dL (ref 6.5–8.1)

## 2024-11-07 LAB — URINALYSIS, ROUTINE W REFLEX MICROSCOPIC
Bilirubin Urine: NEGATIVE
Glucose, UA: NEGATIVE mg/dL
Hgb urine dipstick: NEGATIVE
Ketones, ur: NEGATIVE mg/dL
Leukocytes,Ua: NEGATIVE
Nitrite: NEGATIVE
Protein, ur: NEGATIVE mg/dL
Specific Gravity, Urine: 1.008 (ref 1.005–1.030)
pH: 6 (ref 5.0–8.0)

## 2024-11-07 LAB — LIPASE, BLOOD: Lipase: 50 U/L (ref 11–51)

## 2024-11-07 NOTE — ED Triage Notes (Signed)
 First nurse note: Pt is 11wks PP from c-section and having 7/10 abd pain. Some nausea.

## 2024-11-07 NOTE — Discharge Instructions (Signed)
 Follow-up with your regular doctor as needed.  Return emergency department for increasing lower abdominal pain.  Take Tylenol  for pain as needed.  Apply ice to your lower back.  Then apply wet heat.  And then reapply ice.

## 2024-11-07 NOTE — ED Triage Notes (Signed)
 Pt c/o pain that started in her mid/left abdomen and now wraps around to her back. Pt denies urinary sx. Pt endorses nausea secondary to pain. Pt had BM that was normal just prior to arrival. Pt still has gallbladder and appendix.

## 2024-11-07 NOTE — ED Provider Notes (Signed)
 "  [facility] Provider Note    Event Date/Time   First MD Initiated Contact with Patient 11/07/24 2201     (approximate)   History   Abdominal Pain   HPI  [name] is a 34 y.o. female 10-11 weeks postpartum presents emergency department lower back pain, right lower quadrant pain.  Patient states pain starts and wraps around to her back.  States abdominal pain is a little bit better now but did have some burning when she urinated.  Is unsure if she might have a UTI.  Denies vomiting, diarrhea.  No fever or chills.  Patient did have a C-section      Physical Exam   Triage Vital Signs: ED Triage Vitals  Encounter Vitals Group     BP 11/07/24 1851 127/88     Girls Systolic BP Percentile --      Girls Diastolic BP Percentile --      Boys Systolic BP Percentile --      Boys Diastolic BP Percentile --      Pulse Rate 11/07/24 1851 81     Resp 11/07/24 1851 18     Temp 11/07/24 1851 97.8 F (36.6 C)     Temp Source 11/07/24 1851 Oral     SpO2 11/07/24 1851 100 %     Weight 11/07/24 1851 206 lb (93.4 kg)     Height 11/07/24 1851 5' 4 (1.626 m)     Head Circumference --      Peak Flow --      Pain Score 11/07/24 1858 7     Pain Loc --      Pain Education --      Exclude from Growth Chart --     Most recent vital signs: Vitals:   11/07/24 1851  BP: 127/88  Pulse: 81  Resp: 18  Temp: 97.8 F (36.6 C)  SpO2: 100%     General: Awake, no distress.   CV:  Good peripheral perfusion. regular rate and  rhythm Resp:  Normal effort. Lungs CTA Abd:  No distention.  Mildly tender right lower quadrant, lumbar spine area tender to palpation along the musculature Other:      ED Results / Procedures / Treatments   Labs (all labs ordered are listed, but only abnormal results are displayed) Labs Reviewed  CBC WITH DIFFERENTIAL/PLATELET - Abnormal; Notable for the following components:      Result Value   RDW 16.8 (*)    All other  components within normal limits  COMPREHENSIVE METABOLIC PANEL WITH GFR - Abnormal; Notable for the following components:   Glucose, Bld 148 (*)    ALT 75 (*)    All other components within normal limits  URINALYSIS, ROUTINE W REFLEX MICROSCOPIC - Abnormal; Notable for the following components:   Color, Urine STRAW (*)    APPearance CLEAR (*)    All other components within normal limits  LIPASE, BLOOD  PREGNANCY, URINE     EKG     RADIOLOGY     PROCEDURES:   Procedures  Critical Care:  no Chief Complaint  Patient presents with   Abdominal Pain      MEDICATIONS ORDERED IN ED: Medications - No data to display   IMPRESSION / MDM / ASSESSMENT AND PLAN / ED COURSE  I reviewed the triage vital signs and the nursing notes.  Differential diagnosis includes, but is not limited to, acute pancreatitis, acute cholecystitis, bowel obstruction, acute diverticulitis, acute appendicitis, acute pyelonephritis, UTI, kidney stone, complications of C-section  Patient's presentation is most consistent with acute illness / injury with system symptoms.    Labs reassuring UA pending  Will get UA, if infected we will treat her as UTI.  Otherwise may need to consider acute appendicitis.   UA reassuring  Reexamination of the patient.  She states really feels like it is more of a muscle strain in her lower back as he did relieve the pain.  Did have her jump up and down as she did not have any right lower quadrant pain that was reproduced.  In shared decision making we are going to defer the CT at this time.  She is given very strict instructions to return emergency department if worsening right lower quadrant pain.  Told to have concerns for appendicitis if she is worsening.  She is in agreement with this treatment plan.  Discharged stable condition.   FINAL CLINICAL IMPRESSION(S) / ED DIAGNOSES   Final diagnoses:  Acute midline low back pain without  sciatica  Lower abdominal pain     Rx / DC Orders   ED Discharge Orders     None        Note:  This document was prepared using Dragon voice recognition software and may include unintentional dictation errors.    Gasper Devere ORN, PA-C 11/07/24 2326  "

## 2024-11-08 LAB — PREGNANCY, URINE: Preg Test, Ur: NEGATIVE
